# Patient Record
Sex: Female | Born: 1990 | Race: Black or African American | Hispanic: No | Marital: Married | Smoking: Never smoker
Health system: Southern US, Community
[De-identification: ages and names within clinical notes are randomized; demographics above are authoritative.]

## PROBLEM LIST (undated history)

## (undated) DIAGNOSIS — I341 Nonrheumatic mitral (valve) prolapse: Secondary | ICD-10-CM

## (undated) DIAGNOSIS — L309 Dermatitis, unspecified: Secondary | ICD-10-CM

## (undated) DIAGNOSIS — Z8679 Personal history of other diseases of the circulatory system: Secondary | ICD-10-CM

## (undated) DIAGNOSIS — L509 Urticaria, unspecified: Secondary | ICD-10-CM

## (undated) HISTORY — DX: Urticaria, unspecified: L50.9

## (undated) HISTORY — DX: Dermatitis, unspecified: L30.9

## (undated) HISTORY — PX: NO PAST SURGERIES: SHX2092

---

## 2014-06-30 ENCOUNTER — Other Ambulatory Visit (HOSPITAL_COMMUNITY)
Admission: RE | Admit: 2014-06-30 | Discharge: 2014-06-30 | Disposition: A | Discharge status: Home / Self Care | Payer: PRIVATE HEALTH INSURANCE | Source: Ambulatory Visit | Attending: Obstetrics and Gynecology | Admitting: Obstetrics and Gynecology

## 2014-06-30 DIAGNOSIS — Z01419 Encounter for gynecological examination (general) (routine) without abnormal findings: Secondary | ICD-10-CM | POA: Diagnosis not present

## 2014-12-22 ENCOUNTER — Ambulatory Visit (INDEPENDENT_AMBULATORY_CARE_PROVIDER_SITE_OTHER): Payer: PRIVATE HEALTH INSURANCE | Admitting: Internal Medicine

## 2014-12-22 ENCOUNTER — Encounter: Payer: Self-pay | Admitting: Internal Medicine

## 2014-12-22 VITALS — BP 134/82 | HR 77 | Ht 67.0 in | Wt 170.3 lb

## 2014-12-22 DIAGNOSIS — R0789 Other chest pain: Secondary | ICD-10-CM | POA: Diagnosis not present

## 2014-12-22 DIAGNOSIS — G43909 Migraine, unspecified, not intractable, without status migrainosus: Secondary | ICD-10-CM | POA: Insufficient documentation

## 2014-12-22 DIAGNOSIS — R011 Cardiac murmur, unspecified: Secondary | ICD-10-CM | POA: Diagnosis not present

## 2014-12-22 DIAGNOSIS — I340 Nonrheumatic mitral (valve) insufficiency: Secondary | ICD-10-CM

## 2014-12-22 DIAGNOSIS — R0609 Other forms of dyspnea: Secondary | ICD-10-CM | POA: Diagnosis not present

## 2014-12-22 DIAGNOSIS — R002 Palpitations: Secondary | ICD-10-CM | POA: Insufficient documentation

## 2014-12-22 DIAGNOSIS — R072 Precordial pain: Secondary | ICD-10-CM | POA: Diagnosis not present

## 2014-12-22 DIAGNOSIS — R079 Chest pain, unspecified: Secondary | ICD-10-CM | POA: Insufficient documentation

## 2014-12-22 DIAGNOSIS — G43009 Migraine without aura, not intractable, without status migrainosus: Secondary | ICD-10-CM

## 2014-12-22 HISTORY — DX: Nonrheumatic mitral (valve) insufficiency: I34.0

## 2014-12-22 NOTE — Progress Notes (Signed)
OFFICE NOTE  Chief Complaint:  Murmur, chest pain, shortness of breath, headache, palpitations  Primary Care Physician: No primary care provider on file.  HPI:  Alicia Kelley is a 24 year old female who recently graduated from [employer] and [employer]. 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 Her past medical history is significant for heart murmur which was detected at a young age. She side do cardiologist in [place] who was a pediatric cardiologist. She apparently had an echo in the past and described a valvular abnormality which sounds like mitral valve prolapse. Subsequently she transitioned to a cardiologist in [place] but cannot remember the name of the cardiologist. She's had an echocardiogram before but no records of that are available. Recently she's been having a number of symptoms which are concerning. She is reporting chest pain, shortness of breath, particularly walking up stairs, headache which sounds migrainous in nature-there is photophobia and phonophobia with associated nausea and light sensitivity. In addition she is reporting palpitations. Those occur mostly at night. Her chest pain is sharp and not necessarily associated with exertion. It seems to last for only a few minutes. She does report some anxiety particularly about the symptoms. She recently had screening blood work and physical through her primary care provider. Cholesterol appears to be well-controlled. Her EKG shows sinus rhythm but there are some nonspecific ST and T-wave changes. Family history is significant for murmur in her brother and father.  PMHx:  No past medical history on file.  No past surgical history on file.  FAMHx:  Family History  Problem Relation Age of Onset  . Hypertension Mother   . Healthy Father   . Heart murmur Sister   . Healthy Brother     SOCHx:   reports that she has never  smoked. She has never used smokeless tobacco. Her alcohol and drug histories are not on file.  ALLERGIES:  No Known Allergies  ROS: A comprehensive review of systems was negative except for: Constitutional: positive for fatigue Respiratory: positive for dyspnea on exertion Cardiovascular: positive for chest pain and palpitations Neurological: positive for headaches Behavioral/Psych: positive for anxiety  HOME MEDS: Current Outpatient Prescriptions  Medication Sig Dispense Refill  . NUVARING 0.12-0.015 MG/24HR vaginal ring Insert one (1) ring vaginally every three (3) weeks.     No current facility-administered medications for this visit.    LABS/IMAGING: No results found for this or any previous visit (from the past 48 hour(s)). No results found.  WEIGHTS: Wt Readings from Last 3 Encounters:  12/22/14 170 lb 4.8 oz (77.248 kg)    VITALS: BP 134/82 mmHg  Pulse 77  Ht  (1.702 m)  Wt 170 lb 4.8 oz (77.248 kg)  BMI 26.67 kg/m2  EXAM: General appearance: alert, no distress and thin Neck: no carotid bruit, no JVD and thyroid not enlarged, symmetric, no tenderness/mass/nodules Lungs: clear to auscultation bilaterally Heart: regular rate and rhythm, S1, S2 normal, systolic murmur: holosystolic 3/6, blowing at apex and mid systolic click present Abdomen: soft, non-tender; bowel sounds normal; no masses,  no organomegaly Extremities: extremities normal, atraumatic, no cyanosis or edema Pulses: 2+ and symmetric Skin: Skin color, texture, turgor normal. No rashes or lesions Neurologic: Grossly normal Psych: Mildly anxious  EKG: Normal sinus rhythm at 77, nonspecific ST and T-wave changes  ASSESSMENT: 1. Systolic murmur suggestive of mitral valve prolapse and regurgitation 2. Palpitations 3. Chest pain 4. Progressive  dyspnea on exertion 5. Headaches with migraine features 6. Anxiety  PLAN: 1.   Alicia Kelley has a number of medical problems which are concerning her.  This is causing her significant anxiety. She does have a loud murmur which sounds like mitral valve prolapse. There is a history of valvular disease in her father and sister. I will try to obtain records from her pediatric cardiologist, but plan to repeat an echocardiogram. We'll also schedule a treadmill exercise stress test. With regards her palpitations, this occur mostly on a weekly basis and we may need to consider monitoring after we get these initial test results back. Ultimately, she could benefit from a low-dose nonselective beta blocker which may help treat her palpitations, headache and the other symptoms.  Thanks for the kind referral.  Alicia NoseKenneth C. Raynor Calcaterra, MD, [facility] Attending Cardiologist [facility]  Alicia Kelley 12/22/2014, 5:23 PM

## 2014-12-22 NOTE — Patient Instructions (Signed)
Your physician has requested that you have an exercise tolerance test. For further information please visit [email or web address]. Please also follow instruction sheet, as given.  Your physician has requested that you have an echocardiogram. Echocardiography is a painless test that uses sound waves to create images of your heart. It provides your doctor with information about the size and shape of your heart and how well your heart's chambers and valves are working. This procedure takes approximately one hour. There are no restrictions for this procedure.  Your physician recommends that you schedule a follow-up appointment in: after tests  

## 2015-01-04 ENCOUNTER — Encounter: Payer: Self-pay | Admitting: Internal Medicine

## 2015-01-05 ENCOUNTER — Ambulatory Visit (HOSPITAL_COMMUNITY): Payer: PRIVATE HEALTH INSURANCE | Attending: Cardiology

## 2015-01-05 ENCOUNTER — Other Ambulatory Visit: Payer: Self-pay

## 2015-01-05 DIAGNOSIS — R079 Chest pain, unspecified: Secondary | ICD-10-CM | POA: Diagnosis present

## 2015-01-05 DIAGNOSIS — I340 Nonrheumatic mitral (valve) insufficiency: Secondary | ICD-10-CM | POA: Insufficient documentation

## 2015-01-05 DIAGNOSIS — R0789 Other chest pain: Secondary | ICD-10-CM | POA: Diagnosis not present

## 2015-01-05 DIAGNOSIS — I071 Rheumatic tricuspid insufficiency: Secondary | ICD-10-CM | POA: Diagnosis not present

## 2015-01-05 DIAGNOSIS — R011 Cardiac murmur, unspecified: Secondary | ICD-10-CM | POA: Insufficient documentation

## 2015-01-05 DIAGNOSIS — I341 Nonrheumatic mitral (valve) prolapse: Secondary | ICD-10-CM | POA: Insufficient documentation

## 2015-01-11 ENCOUNTER — Telehealth (HOSPITAL_COMMUNITY): Payer: Self-pay

## 2015-01-11 NOTE — Telephone Encounter (Signed)
Encounter complete. 

## 2015-01-13 ENCOUNTER — Ambulatory Visit (HOSPITAL_COMMUNITY)
Admission: RE | Admit: 2015-01-13 | Discharge: 2015-01-13 | Disposition: A | Discharge status: Home / Self Care | Payer: PRIVATE HEALTH INSURANCE | Source: Ambulatory Visit | Attending: Cardiovascular Disease | Admitting: Cardiovascular Disease

## 2015-01-13 DIAGNOSIS — R0789 Other chest pain: Secondary | ICD-10-CM

## 2015-01-13 DIAGNOSIS — R011 Cardiac murmur, unspecified: Secondary | ICD-10-CM | POA: Insufficient documentation

## 2015-01-13 LAB — EXERCISE TOLERANCE TEST
Estimated workload: 10.2 METS
Exercise duration (min): 9 min
MPHR: 198 {beats}/min
Peak HR: 173 {beats}/min
Percent HR: 87 %
RPE: 15
Rest HR: 84 {beats}/min

## 2015-01-27 ENCOUNTER — Telehealth: Payer: Self-pay | Admitting: Internal Medicine

## 2015-01-27 NOTE — Telephone Encounter (Signed)
[facility] 02/23/15 with Dr Rennis GoldenHilty.  [facility] for Dr Blanchie DessertHilty's schedule on 02/23/15.  lp

## 2015-02-09 ENCOUNTER — Telehealth: Payer: Self-pay | Admitting: Internal Medicine

## 2015-02-10 NOTE — Telephone Encounter (Signed)
Close encounter 

## 2015-02-21 ENCOUNTER — Ambulatory Visit: Payer: PRIVATE HEALTH INSURANCE | Admitting: Internal Medicine

## 2015-02-23 ENCOUNTER — Ambulatory Visit: Payer: PRIVATE HEALTH INSURANCE | Admitting: Internal Medicine

## 2015-03-08 ENCOUNTER — Encounter: Payer: Self-pay | Admitting: Internal Medicine

## 2015-03-08 ENCOUNTER — Ambulatory Visit (INDEPENDENT_AMBULATORY_CARE_PROVIDER_SITE_OTHER): Payer: PRIVATE HEALTH INSURANCE | Admitting: Internal Medicine

## 2015-03-08 VITALS — BP 126/74 | HR 94 | Ht 67.0 in | Wt 174.0 lb

## 2015-03-08 DIAGNOSIS — R002 Palpitations: Secondary | ICD-10-CM

## 2015-03-08 DIAGNOSIS — R011 Cardiac murmur, unspecified: Secondary | ICD-10-CM | POA: Diagnosis not present

## 2015-03-08 DIAGNOSIS — R0609 Other forms of dyspnea: Secondary | ICD-10-CM | POA: Diagnosis not present

## 2015-03-08 DIAGNOSIS — I471 Supraventricular tachycardia: Secondary | ICD-10-CM | POA: Diagnosis not present

## 2015-03-08 MED ORDER — PROPRANOLOL HCL ER 60 MG PO CP24: 60.0000 mg | ORAL_CAPSULE | Freq: Every day | ORAL | Status: DC

## 2015-03-08 NOTE — Patient Instructions (Signed)
Medication Instructions:   START propranolol  once daily  Labwork:  none  Testing/Procedures:  none  Follow-Up:  Your physician wants you to follow-up in: 6 months with Dr. Rennis Golden. You will receive a reminder letter in the mail two months in advance. If you don't receive a letter, please call our office to schedule the follow-up appointment.   Any Other Special Instructions Will Be Listed Below (If Applicable).

## 2015-03-09 DIAGNOSIS — I471 Supraventricular tachycardia: Secondary | ICD-10-CM | POA: Insufficient documentation

## 2015-03-09 DIAGNOSIS — Z8679 Personal history of other diseases of the circulatory system: Secondary | ICD-10-CM | POA: Insufficient documentation

## 2015-03-09 NOTE — Progress Notes (Signed)
OFFICE NOTE  Chief Complaint:  Follow-up stress test and echo  Primary Care Physician: Alicia Salt, PA  HPI:  Alicia Kelley is a 25 year old female who recently graduated from [employer]. 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 Her past medical history is significant for heart murmur which was detected at a young age. She side do cardiologist in [place] who was a pediatric cardiologist. She apparently had an echo in the past and described a valvular abnormality which sounds like mitral valve prolapse. Subsequently she transitioned to a cardiologist in [place] but cannot remember the name of the cardiologist. She's had an echocardiogram before but no records of that are available. Recently she's been having a number of symptoms which are concerning. She is reporting chest pain, shortness of breath, particularly walking up stairs, headache which sounds migrainous in nature-there is photophobia and phonophobia with associated nausea and light sensitivity. In addition she is reporting palpitations. Those occur mostly at night. Her chest pain is sharp and not necessarily associated with exertion. It seems to last for only a few minutes. She does report some anxiety particularly about the symptoms. She recently had screening blood work and physical through her primary care provider. Cholesterol appears to be well-controlled. Her EKG shows sinus rhythm but there are some nonspecific ST and T-wave changes. Family history is significant for murmur in her brother and father.  I the pleasure see Ms. Alicia Kelley back today in follow-up. She underwent an exercise treadmill stress test which was low risk however she had interim development of PAT or possibly atrial tachycardia during the procedure. This likely explains the palpitation she's been feeling. A repeat echo was performed which  shows normal LV function however she does have thickening and bileaflet prolapse of the mitral valve and moderate regurgitation. This is similar to the findings of her echo which was performed by [facility] pediatric cardiology in 2014. She was previously seeing Alicia Kelley. The findings now indicate that she does have some left atrial enlargement which is mild and was not previously present.  PMHx:  History reviewed. No pertinent past medical history.  History reviewed. No pertinent past surgical history.  FAMHx:  Family History  Problem Relation Age of Onset  . Hypertension Mother   . Healthy Father   . Heart murmur Sister   . Healthy Brother     SOCHx:   reports that she has never smoked. She has never used smokeless tobacco. Her alcohol and drug histories are not on file.  ALLERGIES:  No Known Allergies  ROS: A comprehensive review of systems was negative except for: Constitutional: positive for fatigue Respiratory: positive for dyspnea on exertion Cardiovascular: positive for chest pain and palpitations Neurological: positive for headaches Behavioral/Psych: positive for anxiety  HOME MEDS: Current Outpatient Prescriptions  Medication Sig Dispense Refill  . Multiple Vitamins-Minerals (MULTIVITAMIN ADULT PO) Take 1 tablet by mouth daily.    Marland Kitchen NUVARING 0.12-0.015 MG/24HR vaginal ring Insert one (1) ring vaginally every three (3) weeks.    . vitamin E 400 UNIT capsule Take 400 Units by mouth daily.    . propranolol ER (INDERAL LA) 60 MG 24 hr capsule Take 1 capsule (60 mg total) by mouth daily. 30 capsule 6   No current facility-administered medications for this visit.    LABS/IMAGING: No results found for this or any previous visit (from the past 48 hour(s)). No  results found.  WEIGHTS: Wt Readings from Last 3 Encounters:  03/08/15 174 lb (78.926 kg)  12/22/14 170 lb 4.8 oz (77.248 kg)    VITALS: BP 126/74 mmHg  Pulse 94  Ht  (1.702 m)  Wt 174 lb (78.926 kg)  BMI  27.25 kg/m2  EXAM: deferred  EKG: Deferred  ASSESSMENT: 1. Mitral valve prolapse with moderate regurgitation 2. Left atrial enlargement 3. PAT or paroxysmal atrial flutter 4. Chest pain 5. Progressive dyspnea on exertion 6. Headaches with migraine features 7. Anxiety  PLAN: 1.   Ms. 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 She has bileaflet prolapse and moderate regurgitation. Unfortunately she's developed some mild left atrial enlargement. We talked at length today about the long-term history of mitral regurgitation and even though that her regurgitation is moderate comments likely that she could develop worsening left atrial enlargement and put her at much higher risk of recurrent atrial fibrillation or flutter. 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 This is a constellation of symptoms which is quite common. She may benefit from low-dose beta blocker. I proposed a nonselective beta blocker propranolol XL 60 mg daily. We'll try that to see if it helps with her symptoms and may to some extent suppress some of her atrial tachycardias. She wishes to continue to follow with me and will follow her valve with echocardiography. She may wish to have earlier referral to specialty Center as many centers are now operating a moderate regurgitation hopefully preventing the structural heart changes that can develop later on.  Follow-up in 6 months.  Alicia Nose, MD, [facility] Attending Cardiologist [facility]  Alicia Kelley 03/09/2015, 7:42 AM

## 2019-07-10 ENCOUNTER — Telehealth: Payer: Self-pay

## 2019-07-10 NOTE — Telephone Encounter (Signed)
OR

## 2019-08-19 ENCOUNTER — Encounter: Payer: Self-pay | Admitting: Internal Medicine

## 2019-08-19 ENCOUNTER — Other Ambulatory Visit: Payer: Self-pay

## 2019-08-19 ENCOUNTER — Ambulatory Visit (INDEPENDENT_AMBULATORY_CARE_PROVIDER_SITE_OTHER): Payer: Managed Care, Other (non HMO) | Admitting: Internal Medicine

## 2019-08-19 VITALS — BP 112/80 | HR 81 | Ht 68.0 in | Wt 190.0 lb

## 2019-08-19 DIAGNOSIS — I341 Nonrheumatic mitral (valve) prolapse: Secondary | ICD-10-CM

## 2019-08-19 DIAGNOSIS — R002 Palpitations: Secondary | ICD-10-CM

## 2019-08-19 MED ORDER — METOPROLOL TARTRATE 25 MG PO TABS
ORAL_TABLET | ORAL | 3 refills | Status: DC
Start: 2019-08-19 — End: 2021-12-01

## 2019-08-19 NOTE — Patient Instructions (Signed)
Medication Instructions:  Dr. Rennis Golden has prescribed metoprolol tartrate to use as needed for palpitations   *If you need a refill on your cardiac medications before your next appointment, please call your pharmacy*  Testing/Procedures: Your physician has requested that you have an echocardiogram. Echocardiography is a painless test that uses sound waves to create images of your heart. It provides your doctor with information about the size and shape of your heart and how well your heart's chambers and valves are working. This procedure takes approximately one hour. There are no restrictions for this procedure. -- [address] - [address] Floor   Follow-Up: At [facility], you and your health needs are our priority.  As part of our continuing mission to provide you with exceptional heart care, we have created designated Provider Care Teams.  These Care Teams include your primary Cardiologist (physician) and Advanced Practice Providers (APPs -  Physician Assistants and Nurse Practitioners) who all work together to provide you with the care you need, when you need it.  We recommend signing up for the patient portal called "MyChart".  Sign up information is provided on this After Visit Summary.  MyChart is used to connect with patients for Virtual Visits (Telemedicine).  Patients are able to view lab/test results, encounter notes, upcoming appointments, etc.  Non-urgent messages can be sent to your provider as well.   To learn more about what you can do with MyChart, go to [email or web address].    Your next appointment:   After echocardiogram  The format for your next appointment:   In Person  Provider:   You may see Dr. Rennis Golden or one of the following Advanced Practice Providers on your designated Care Team:    Azalee Course, PA-C  Micah Flesher, [place] or   Judy Pimple, [place]    Other Instructions

## 2019-08-19 NOTE — Progress Notes (Signed)
OFFICE NOTE  Chief Complaint:  Reestablish cardiac care  Primary Care Physician: Norm Salt, PA  HPI:  Alicia Kelley is a 29 year old female who recently graduated from [employer]. 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 Her past medical history is significant for heart murmur which was detected at a young age. She side do cardiologist in [place] who was a pediatric cardiologist. She apparently had an echo in the past and described a valvular abnormality which sounds like mitral valve prolapse. Subsequently she transitioned to a cardiologist in [place] but cannot remember the name of the cardiologist. She's had an echocardiogram before but no records of that are available. Recently she's been having a number of symptoms which are concerning. She is reporting chest pain, shortness of breath, particularly walking up stairs, headache which sounds migrainous in nature-there is photophobia and phonophobia with associated nausea and light sensitivity. In addition she is reporting palpitations. Those occur mostly at night. Her chest pain is sharp and not necessarily associated with exertion. It seems to last for only a few minutes. She does report some anxiety particularly about the symptoms. She recently had screening blood work and physical through her primary care provider. Cholesterol appears to be well-controlled. Her EKG shows sinus rhythm but there are some nonspecific ST and T-wave changes. Family history is significant for murmur in her brother and father.  I the pleasure see Alicia Kelley back today in follow-up. She underwent an exercise treadmill stress test which was low risk however she had interim development of PAT or possibly atrial tachycardia during the procedure. This likely explains the palpitation she's been feeling. A repeat echo was performed which shows  normal LV function however she does have thickening and bileaflet prolapse of the mitral valve and moderate regurgitation. This is similar to the findings of her echo which was performed by [facility]ology in 2014. She was previously seeing Dr. Mayer Camel. The findings now indicate that she does have some left atrial enlargement which is mild and was not previously present.  08/19/2019  Alicia Kelley is here to reestablish cardiac care.  She is considered a new patient since I have not seen her in more than 3 years.  It sounds like overall she has been doing well until recently when she has developed some palpitations.  Those were more frequent several weeks ago.  She had changed her diet around some and was drinking a tea which apparently contained twice as much caffeine is coffee and I suspect this may have provoked her palpitations.  We had discovered that she has moderate mitral valve prolapse with mild to moderate mitral regurgitation and some left atrial enlargement by echo in 2016.  She is not had a repeat echo since then.  Overall she denies any chest pain or worsening shortness of breath with exertion.  PMHx:  No past medical history on file.  No past surgical history on file.  FAMHx:  Family History  Problem Relation Age of Onset   Hypertension Mother    Healthy Father    Heart murmur Sister    Healthy Brother     SOCHx:   reports that she has never smoked. She has never used smokeless tobacco. No history on file for alcohol use and drug use.  ALLERGIES:  No Known Allergies  ROS: Pertinent items noted in HPI and remainder of comprehensive ROS otherwise  negative.  HOME MEDS: Current Outpatient Medications  Medication Sig Dispense Refill   Biotin 300 MCG TABS Take 1 tablet by mouth. 1 tablet daily     folic acid (FOLVITE) 1 MG tablet Take 1 mg by mouth daily. 1 tablet Daily     Multiple Vitamins-Minerals (MULTIVITAMIN ADULT PO) Take 1 tablet by mouth daily.      Prenatal Multivit-Min-Fe-FA (PRE-NATAL FORMULA) TABS Take 2 tablets by mouth. 12 tablets daily     metoprolol tartrate (LOPRESSOR) 25 MG tablet Take one-half to one tablet by mouth twice daily as needed for palpitations 60 tablet 3   No current facility-administered medications for this visit.    LABS/IMAGING: No results found for this or any previous visit (from the past 48 hour(s)). No results found.  WEIGHTS: Wt Readings from Last 3 Encounters:  08/19/19 190 lb (86.2 kg)  03/08/15 174 lb (78.9 kg)  12/22/14 170 lb 4.8 oz (77.2 kg)    VITALS: BP 112/80    Pulse 81    Ht 5\' 8"  (1.727 m)    Wt 190 lb (86.2 kg)    SpO2 98%    BMI 28.89 kg/m   EXAM: General appearance: alert and no distress Neck: no carotid bruit, no JVD and thyroid not enlarged, symmetric, no tenderness/mass/nodules Lungs: clear to auscultation bilaterally Heart: regular rate and rhythm, S1, S2 normal and systolic murmur: early systolic 3/6, blowing at apex Abdomen: soft, non-tender; bowel sounds normal; no masses,  no organomegaly Extremities: extremities normal, atraumatic, no cyanosis or edema Pulses: 2+ and symmetric Skin: Skin color, texture, turgor normal. No rashes or lesions Neurologic: Grossly normal Psych: Pleasant  EKG: Normal sinus rhythm 81, nonspecific ST-T wave changes-personally reviewed  ASSESSMENT: 1. Mitral valve prolapse with moderate regurgitation 2. Palpitations 3. Left atrial enlargement 4. PAT or paroxysmal atrial flutter 5. Chest pain 6. Headaches with migraine features 7. Anxiety  PLAN: 1.   Ms. had recent palpitations although they have improved somewhat.  She is no longer on propranolol which she took for short period of time many years ago.  I will provide her with some metoprolol tartrate should she could take between 12-1/2 and 25 mg up to twice a day if needed for persistent palpitations/tachycardia.  She does have a loud mitral murmur and was noted to have at  least moderate regurgitation in 2016 by echo.  I would recommend a repeat echo to look at the stability of that valve.  She does not seem to necessarily be symptomatic with it however if it is progressed to severe regurgitation or appears moderate to severe we may need to further evaluated with TEE to determine if she will possibly need mitral valve replacement at sometime in the near future.  Follow-up with me afterwards.  2017, MD, [facility], FACP  [place]   [facility]  Medical Director of the Advanced Lipid Disorders &  Cardiovascular Risk Reduction Clinic Diplomate of the American Board of Clinical Lipidology Attending Cardiologist  Direct Dial: [phone]   Fax: [phone]  Website:  [email or web address] 08/19/2019, 8:58 PM

## 2019-09-02 ENCOUNTER — Other Ambulatory Visit: Payer: Self-pay

## 2019-09-02 ENCOUNTER — Ambulatory Visit (HOSPITAL_COMMUNITY): Payer: Managed Care, Other (non HMO) | Attending: Cardiology

## 2019-09-02 DIAGNOSIS — I341 Nonrheumatic mitral (valve) prolapse: Secondary | ICD-10-CM | POA: Diagnosis present

## 2019-09-02 DIAGNOSIS — R002 Palpitations: Secondary | ICD-10-CM | POA: Insufficient documentation

## 2019-09-02 LAB — ECHOCARDIOGRAM COMPLETE
Area-P 1/2: 3.85 cm2
S' Lateral: 3 cm

## 2019-10-23 ENCOUNTER — Ambulatory Visit: Payer: Managed Care, Other (non HMO) | Admitting: Internal Medicine

## 2019-11-17 LAB — OB RESULTS CONSOLE RUBELLA ANTIBODY, IGM: Rubella: IMMUNE

## 2019-11-17 LAB — OB RESULTS CONSOLE HEPATITIS B SURFACE ANTIGEN: Hepatitis B Surface Ag: NEGATIVE

## 2020-01-18 ENCOUNTER — Other Ambulatory Visit: Payer: Self-pay

## 2020-01-18 ENCOUNTER — Ambulatory Visit
Admission: EM | Admit: 2020-01-18 | Discharge: 2020-01-18 | Disposition: A | Discharge status: Home / Self Care | Payer: Managed Care, Other (non HMO)

## 2020-01-23 NOTE — L&D Delivery Note (Signed)
Delivery Note Labor onset: 07/02/2020  Labor Onset Time: 0456 Complete dilation at 11:08 AM  Onset of pushing at 1116 FHR second stage Cat 1 Analgesia/Anesthesia intrapartum: None  Guided pushing with maternal urge. FHR in the 90's without out ascension, x6 min, right mediolateral episiotomy performed. Delivery of a viable female at 34. Fetal head delivered in LOA position.  Nuchal cord: x1, loose, reduced on perineum.  Infant placed on maternal abd, dried, and tactile stim.  Cord double clamped after pulsation stopped and cut by Father.  Father and pt's sister present for birth.  Cord blood sample collected: Yes Arterial cord blood sample collected: No  Placenta delivered Tomasa Blase, intact, with 3 VC.  Placenta to L&D. Uterine tone firm, bleeding moderate w/ clots. IV dislodged during second staged. Bleeding managed w/ Pitocin 10 units IM  Right mediolateral episiotomy w/o extension identified.  Anesthesia: Lidocaine 1% Repair: 2-0 Vicryl CT QBL/EBL (mL): 350 Complications: terminal bradycardia APGAR: APGAR (1 MIN):  8 APGAR (5 MINS): 9   APGAR (10 MINS):   Mom to postpartum.  Baby to Couplet care / Skin to Skin. Baby Boy "[place]" parents request in-pt circ.   Roma Schanz MSN, CNM 07/02/2020, 12:30 PM

## 2020-01-28 ENCOUNTER — Telehealth: Payer: Managed Care, Other (non HMO) | Admitting: Physician Assistant

## 2020-02-01 ENCOUNTER — Telehealth (INDEPENDENT_AMBULATORY_CARE_PROVIDER_SITE_OTHER): Payer: Managed Care, Other (non HMO) | Admitting: Cardiology

## 2020-02-01 VITALS — BP 130/75 | HR 101 | Ht 68.0 in | Wt 189.0 lb

## 2020-02-01 DIAGNOSIS — Z8679 Personal history of other diseases of the circulatory system: Secondary | ICD-10-CM | POA: Diagnosis not present

## 2020-02-01 DIAGNOSIS — I341 Nonrheumatic mitral (valve) prolapse: Secondary | ICD-10-CM

## 2020-02-01 DIAGNOSIS — I340 Nonrheumatic mitral (valve) insufficiency: Secondary | ICD-10-CM | POA: Diagnosis not present

## 2020-02-01 HISTORY — DX: Nonrheumatic mitral (valve) prolapse: I34.1

## 2020-02-01 NOTE — Progress Notes (Signed)
Virtual Visit via Telephone Note   This visit type was conducted due to national recommendations for restrictions regarding the COVID-19 Pandemic (e.g. social distancing) in an effort to limit this patient's exposure and mitigate transmission in our community.  Due to her co-morbid illnesses, this patient is at least at moderate risk for complications without adequate follow up.  This format is felt to be most appropriate for this patient at this time.  The patient did not have access to video technology/had technical difficulties with video requiring transitioning to audio format only (telephone).  All issues noted in this document were discussed and addressed.  No physical exam could be performed with this format.  Please refer to the patient's chart for her  consent to telehealth for [facility].    Date:  02/01/2020   ID:  Alicia Kelley, DOB [birth year], MRN [id number] The patient was identified using 2 identifiers.  Patient Location: Home Provider Location: Home Office  PCP:  Pcp, No  Cardiologist:  [name], MD  Electrophysiologist:  None   Evaluation Performed:  Follow-Up Visit  Chief Complaint:  none  History of Present Illness:    Alicia Kelley is a 30 y.o. female with a history of mitral valve prolapse and moderate mitral regurgitation.  She is also had history of PAT in the past.  She had been on a beta-blocker but tells me that she is now [redacted] weeks pregnant.  She is not taking her beta-blocker now.  She has not been troubled with palpitations.  She did have CO VID couple weeks ago over the holidays.  She has had some residual complaints of headache but no other symptoms that are concerning.  She denies any unusual shortness of breath.  Her pregnancy is going well so far.  The patient does not have symptoms concerning for COVID-19 infection (fever, chills, cough, or new shortness of breath).    No past medical history on file. No past surgical history on file.    Current Meds  Medication Sig  . folic acid (FOLVITE) 1 MG tablet Take 1 mg by mouth daily. 1 tablet Daily  . metoprolol tartrate (LOPRESSOR) 25 MG tablet Take one-half to one tablet by mouth twice daily as needed for palpitations  . Prenatal Multivit-Min-Fe-FA (PRE-NATAL FORMULA) TABS Take 2 tablets by mouth. 12 tablets daily     Allergies:   Patient has no known allergies.   Social History   Tobacco Use  . Smoking status: Never Smoker  . Smokeless tobacco: Never Used     Family Hx: The patient's family history includes Healthy in her brother and father; Heart murmur in her sister; Hypertension in her mother.  ROS:   Please see the history of present illness.    All other systems reviewed and are negative.   Prior CV studies:   The following studies were reviewed today: Echo 09/02/2019- IMPRESSIONS    1. Left ventricular ejection fraction, by estimation, is 60 to 65%. The  left ventricle has normal function. The left ventricle has no regional  wall motion abnormalities. Left ventricular diastolic parameters were  normal. The average left ventricular  global longitudinal strain is -24.8 %. The global longitudinal strain is  normal.  2. Right ventricular systolic function is normal. The right ventricular  size is normal.  3. Mid systolic MVP regurgitation. The mitral valve is myxomatous.  Moderate mitral valve regurgitation. No evidence of mitral stenosis.  4. The aortic valve is normal in structure. Aortic valve regurgitation  is  not visualized. No aortic stenosis is present.  5. The inferior vena cava is normal in size with greater than 50%  respiratory variability, suggesting right atrial pressure of 3 mmHg.   Comparison(s): Prior images unable to be directly viewed, comparison made  by report only. 01/05/15.   Conclusion(s)/Recommendation(s): Consider cardiac MRI to better clarify  regurgitant volume.    Labs/Other Tests and Data Reviewed:    EKG:  An  ECG dated 08/19/2019 was personally reviewed today and demonstrated:  NSR  Recent Labs: No results found for requested labs within last 8760 hours.   Recent Lipid Panel No results found for: CHOL, TRIG, HDL, CHOLHDL, LDLCALC, LDLDIRECT  Wt Readings from Last 3 Encounters:  02/01/20 189 lb (85.7 kg)  08/19/19 190 lb (86.2 kg)  03/08/15 174 lb (78.9 kg)     Risk Assessment/Calculations:      Objective:    Vital Signs:  BP 130/75   Pulse (!) 101   Ht 5\' 8"  (1.727 m)   Wt 189 lb (85.7 kg)   BMI 28.74 kg/m    VITAL SIGNS:  reviewed  ASSESSMENT & PLAN:    Moderate MR- Echo Aug 2021  Pregnancy- Pt is [redacted] weeks pregnant-  H/O PAT- No palpitations- I asked her to let Sep 2021 know if she develops increasing palpitations or SOB.  I suggested she remain off Metoprolol if possible.  If she has increasing symptoms she should contact us to discus treatment.   Plan: F/U Dr [place] in 9 months or sooner PRN.        COVID-19 Education: The signs and symptoms of COVID-19 were discussed with the patient and how to seek care for testing (follow up with PCP or arrange E-visit).  The importance of social distancing was discussed today.  Time:   Today, I have spent 15 minutes with the patient with telehealth technology discussing the above problems.     Medication Adjustments/Labs and Tests Ordered: Current medicines are reviewed at length with the patient today.  Concerns regarding medicines are outlined above.   Tests Ordered: No orders of the defined types were placed in this encounter.   Medication Changes: No orders of the defined types were placed in this encounter.   Follow Up:  In Person Dr Rennis Golden in 9 months  Signed, Rennis Golden, Corine Shelter  02/01/2020 11:33 AM    [facility]

## 2020-02-01 NOTE — Patient Instructions (Signed)
Medication Instructions:  Your Physician recommend you continue on your current medication as directed.    *If you need a refill on your cardiac medications before your next appointment, please call your pharmacy*   Lab Work: None   Testing/Procedures: None   Follow-Up: At [facility], you and your health needs are our priority.  As part of our continuing mission to provide you with exceptional heart care, we have created designated Provider Care Teams.  These Care Teams include your primary Cardiologist (physician) and Advanced Practice Providers (APPs -  Physician Assistants and Nurse Practitioners) who all work together to provide you with the care you need, when you need it.  We recommend signing up for the patient portal called "MyChart".  Sign up information is provided on this After Visit Summary.  MyChart is used to connect with patients for Virtual Visits (Telemedicine).  Patients are able to view lab/test results, encounter notes, upcoming appointments, etc.  Non-urgent messages can be sent to your provider as well.   To learn more about what you can do with MyChart, go to [email or web address].    Your next appointment:   9 months  The format for your next appointment:   In Person  Provider:   K. Italy Hilty, MD

## 2020-03-31 LAB — OB RESULTS CONSOLE RPR: RPR: NONREACTIVE

## 2020-03-31 LAB — OB RESULTS CONSOLE HIV ANTIBODY (ROUTINE TESTING): HIV: NONREACTIVE

## 2020-06-10 LAB — OB RESULTS CONSOLE GC/CHLAMYDIA
Chlamydia: NEGATIVE
Gonorrhea: NEGATIVE

## 2020-06-10 LAB — OB RESULTS CONSOLE GBS: GBS: POSITIVE

## 2020-07-01 ENCOUNTER — Inpatient Hospital Stay (HOSPITAL_BASED_OUTPATIENT_CLINIC_OR_DEPARTMENT_OTHER): Payer: Managed Care, Other (non HMO)

## 2020-07-01 ENCOUNTER — Encounter (HOSPITAL_COMMUNITY): Payer: Self-pay | Admitting: Obstetrics and Gynecology

## 2020-07-01 ENCOUNTER — Observation Stay (HOSPITAL_COMMUNITY)
Admission: AD | Admit: 2020-07-01 | Discharge: 2020-07-01 | Disposition: A | Discharge status: Home / Self Care | Payer: Managed Care, Other (non HMO) | Attending: Obstetrics and Gynecology | Admitting: Obstetrics and Gynecology

## 2020-07-01 DIAGNOSIS — Z8616 Personal history of COVID-19: Secondary | ICD-10-CM | POA: Insufficient documentation

## 2020-07-01 DIAGNOSIS — Z3A39 39 weeks gestation of pregnancy: Secondary | ICD-10-CM | POA: Diagnosis not present

## 2020-07-01 DIAGNOSIS — O368130 Decreased fetal movements, third trimester, not applicable or unspecified: Secondary | ICD-10-CM | POA: Diagnosis not present

## 2020-07-01 DIAGNOSIS — O288 Other abnormal findings on antenatal screening of mother: Secondary | ICD-10-CM | POA: Diagnosis present

## 2020-07-01 DIAGNOSIS — O368190 Decreased fetal movements, unspecified trimester, not applicable or unspecified: Secondary | ICD-10-CM | POA: Diagnosis present

## 2020-07-01 HISTORY — DX: Personal history of other diseases of the circulatory system: Z86.79

## 2020-07-01 HISTORY — DX: Nonrheumatic mitral (valve) prolapse: I34.1

## 2020-07-01 MED ORDER — ONDANSETRON HCL 4 MG/2ML IJ SOLN: 4.0000 mg | Freq: Four times a day (QID) | INTRAMUSCULAR | Status: DC | PRN

## 2020-07-01 MED ORDER — OXYTOCIN BOLUS FROM INFUSION: 333.0000 mL | Freq: Once | INTRAVENOUS | Status: DC

## 2020-07-01 MED ORDER — TERBUTALINE SULFATE 1 MG/ML IJ SOLN: 0.2500 mg | Freq: Once | INTRAMUSCULAR | Status: DC | PRN

## 2020-07-01 MED ORDER — LIDOCAINE HCL (PF) 1 % IJ SOLN: 30.0000 mL | INTRAMUSCULAR | Status: DC | PRN

## 2020-07-01 MED ORDER — SODIUM CHLORIDE 0.9 % IV SOLN: 5.0000 10*6.[IU] | Freq: Once | INTRAVENOUS | Status: DC

## 2020-07-01 MED ORDER — ACETAMINOPHEN 325 MG PO TABS: 650.0000 mg | ORAL_TABLET | ORAL | Status: DC | PRN

## 2020-07-01 MED ORDER — OXYCODONE-ACETAMINOPHEN 5-325 MG PO TABS: 2.0000 | ORAL_TABLET | ORAL | Status: DC | PRN

## 2020-07-01 MED ORDER — OXYTOCIN-SODIUM CHLORIDE 30-0.9 UT/500ML-% IV SOLN: 2.5000 [IU]/h | INTRAVENOUS | Status: DC

## 2020-07-01 MED ORDER — OXYTOCIN-SODIUM CHLORIDE 30-0.9 UT/500ML-% IV SOLN: 1.0000 m[IU]/min | INTRAVENOUS | Status: DC

## 2020-07-01 MED ORDER — PENICILLIN G POT IN DEXTROSE 60000 UNIT/ML IV SOLN: 3.0000 10*6.[IU] | INTRAVENOUS | Status: DC

## 2020-07-01 MED ORDER — FENTANYL CITRATE (PF) 100 MCG/2ML IJ SOLN: 50.0000 ug | INTRAMUSCULAR | Status: DC | PRN

## 2020-07-01 MED ORDER — SOD CITRATE-CITRIC ACID 500-334 MG/5ML PO SOLN: 30.0000 mL | ORAL | Status: DC | PRN

## 2020-07-01 MED ORDER — LACTATED RINGERS IV SOLN: 500.0000 mL | INTRAVENOUS | Status: DC | PRN

## 2020-07-01 MED ORDER — HYDROXYZINE HCL 50 MG PO TABS: 50.0000 mg | ORAL_TABLET | Freq: Four times a day (QID) | ORAL | Status: DC | PRN

## 2020-07-01 MED ORDER — OXYCODONE-ACETAMINOPHEN 5-325 MG PO TABS: 1.0000 | ORAL_TABLET | ORAL | Status: DC | PRN

## 2020-07-01 NOTE — Progress Notes (Signed)
Monitors off for transfer to ultrasound for BPP.

## 2020-07-01 NOTE — Discharge Summary (Addendum)
OB Discharge Summary     Patient Name: Alicia Kelley DOB: Jun 12, 1990 MRN: [id number]  Date of admission: 07/01/2020 Delivering MD: This patient has no babies on file.   Admitting diagnosis: Non-reactive NST (non-stress test) [O28.8] Intrauterine pregnancy: Unknown     Secondary diagnosis:  Principal Problem:   Non-reactive NST (non-stress test) Active Problems:   Decreased fetal movement                                History of Present Illness: Ms. Alicia Kelley is a 30 y.o. female, G1P0, who presents at Unknown weeks gestation. The patient has been followed at  [facility] and Gynecology  Her pregnancy has been complicated by:  Patient Active Problem List   Diagnosis Date Noted   Non-reactive NST (non-stress test) 07/01/2020   Decreased fetal movement 07/01/2020   MVP (mitral valve prolapse) 02/01/2020   History of PAT (paroxysmal atrial tachycardia) 03/09/2015   Moderate mitral regurgitation 12/22/2014   Chest pain 12/22/2014   DOE (dyspnea on exertion) 12/22/2014   Heart palpitations 12/22/2014   Migraine 12/22/2014     Active Ambulatory Problems    Diagnosis Date Noted   Moderate mitral regurgitation 12/22/2014   Chest pain 12/22/2014   DOE (dyspnea on exertion) 12/22/2014   Heart palpitations 12/22/2014   Migraine 12/22/2014   History of PAT (paroxysmal atrial tachycardia) 03/09/2015   MVP (mitral valve prolapse) 02/01/2020   Resolved Ambulatory Problems    Diagnosis Date Noted   No Resolved Ambulatory Problems   Past Medical History:  Diagnosis Date   H/O mitral valve prolapse    Mitral valve prolapse      Hospital course:   Pt was admitted on 07/01/2020 for the following, HPI per [facility] Physician DR Connye Burkitt  HPI: 30 y.o. G1P0 @ [redacted]w[redacted]d estimated gestational age (as dated by LMP c/w 7 week ultrasound) is admitted for induction of labor for decreased fetal movement and non-reactive NST.   Patient reports some decrease in movement which she  felt was normal and did not have concern regarding the decrease. NST in the office non-reactive but reassuring. She desired to await natural labor by 41 weeks but ultimately in agreement with induction of labor now.   Leakage of fluid:  No Vaginal bleeding:  No Contractions:  No Fetal movement:  Decreased   Prenatal care has been provided by Dr. Steva Ready [facility])   ROS:  Denies fevers, chills, chest pain, visual changes, SOB, RUQ/epigastric pain, N/V, dysuria, hematuria, or sudden onset/worsening bilateral LE or facial edema.   Pregnancy complicated by: Mitral valve prolapse with moderate regurgitation and mild left atrial enlargement Size greater than dates this pregnancy (normal growth [place]) Covid-19 this pregnancy, resolved (12/2019)  07/01/2020 Pt presented and appear shocked and confused to be getting any induction, pt placed on NST, reactive and Cat 1 with 15x15 acels noted, pt ate dinner prior to coming and pt performed fetal kick counts here with 10 movement noted in less then 2 hours. BPP also done resulted 8/8, pt stable, Discussed recommendation of staying for IOL at term, D Sallye Ober and DR Connye Burkitt recommend staying for induction, pt given R/B/A, pt took all assessment under consideration and decided to go home to wait for natural labor to ensure. Pt will f/u with [facility] Physician early next week.   NST: baseline 145, moderate variability, +acels, -decels, Reactive NST. Cat 1  Physical exam  Vitals:   07/01/20 2120 07/01/20 2155  BP: 123/70 125/60  Pulse: 75 72  Resp: 18 18  Temp: 98 F (36.7 C) 97.7 F (36.5 C)  TempSrc: Axillary Oral   General: alert, cooperative, and no distress Lochia: appropriate Uterine Fundus: firm Perineum: Intact DVT Evaluation: No evidence of DVT seen on physical exam. Negative Homan's sign. No cords or calf tenderness. No significant calf/ankle edema.  Labs: No results found for: WBC, HGB, HCT, MCV, PLT No flowsheet data  found.  Date of discharge: 07/01/2020 Discharge Diagnoses:  DFM Discharge instruction: per After Visit Summary and "Baby and Me Booklet".  After visit meds:   Activity:           unrestricted Advance as tolerated. Pelvic rest for 6 weeks.  Diet:                routine Medications: PNV Condition:  Pt discharge to home in stable LABOR: When contractions begin, you should start to time them from the beginning of one contraction to the beginning of the next.  When contractions are 5-10 minutes apart or less and have been regular for at least an hour, you should call your health care provider.  Notify your doctor if any of the following occur: 1. Bleeding from the vagina 7. Sudden, constant, or occasional abdominal pain  2. Pain or burning when urinating 8. Sudden gushing of fluid from the vagina (with or without continued leaking)  3. Chills or fever 9. Fainting spells, "black outs" or loss of consciousness  4. Increase in vaginal discharge 10. Severe or continued nausea or vomiting  5. Pelvic pressure (sudden increase) 11. Blurring of vision or spots before the eyes  6. Baby moving less than usual 12. Leaking of fluid    FETAL KICK COUNT: Lie on your left side for one hour after a meal, and count the number of times your baby kicks. If it is less than 5 times, get up, move around and drink some juice. Repeat the test 30 minutes later. If it is still less than 5 kicks in an hour, notify your doctor.  Meds: Allergies as of 07/01/2020   No Known Allergies      Medication List     TAKE these medications    folic acid 1 MG tablet Commonly known as: FOLVITE Take 1 mg by mouth daily. 1 tablet Daily   metoprolol tartrate 25 MG tablet Commonly known as: LOPRESSOR Take one-half to one tablet by mouth twice daily as needed for palpitations   Pre-Natal Formula Tabs Take 2 tablets by mouth. 12 tablets daily        Discharge Follow Up:   Follow-up Information     Gynecology, [facility]  Obstetrics And. Schedule an appointment as soon as possible for a visit.   Specialty: Obstetrics and Gynecology Why: Early next week. Contact information: [address] [place] [address] [phone]                  [facility], NP-C, CNM 07/01/2020, 10:12 PM  Dale Mio, FNP  MD Addendum. Patient reported feeling normal fetal movements or more than 10 kicks in 2 hours.  NST was category 1.  She desires to be discharged to return home and she was deemed stable for discharge.  Dr. Sallye Ober. 07/03/2020.

## 2020-07-01 NOTE — H&P (Signed)
HPI: 30 y.o. G1P0 @ [redacted]w[redacted]d estimated gestational age (as dated by LMP c/w 7 week ultrasound) is admitted for induction of labor for decreased fetal movement and non-reactive NST.  Patient reports some decrease in movement which she felt was normal and did not have concern regarding the decrease. NST in the office non-reactive but reassuring. She desired to await natural labor by 41 weeks but ultimately in agreement with induction of labor now.  Leakage of fluid:  No Vaginal bleeding:  No Contractions:  No Fetal movement:  Decreased  Prenatal care has been provided by Dr. Steva Ready [facility])  ROS:  Denies fevers, chills, chest pain, visual changes, SOB, RUQ/epigastric pain, N/V, dysuria, hematuria, or sudden onset/worsening bilateral LE or facial edema.  Pregnancy complicated by: Mitral valve prolapse with moderate regurgitation and mild left atrial enlargement Size greater than dates this pregnancy (normal growth [place]) Covid-19 this pregnancy, resolved (12/2019)   Prenatal Transfer Tool  Maternal Diabetes: No Genetic Screening: Normal Maternal Ultrasounds/Referrals: Normal Fetal Ultrasounds or other Referrals:  None Maternal Substance Abuse:  No Significant Maternal Medications:  None Significant Maternal Lab Results: Group B Strep positive   Prenatal Labs Blood type:  A Positive Antibody screen:  Negative CBC:  H/H 10.5/31 Rubella: Immune RPR:  Non-reactive Hep B:  Negative Hep C:  Negative HIV:  Negative GC/CT:  Negative Glucola: 116 (wnl)  Immunizations: Tdap: Given prenatally Flu: Received 12/28/20  OBHx:  OB History     Gravida  1   Para      Term      Preterm      AB      Living         SAB      IAB      Ectopic      Multiple      Live Births             PMHx:  See above Meds:  PNV Allergy:  No Known Allergies SurgHx: None SocHx:   Denies Tobacco, ETOH, illicit drugs  O: There were no vitals taken for this visit. Gen.  AAOx3, NAD CV.  RRR  Resp. CTAB, no wheezes/rales/rhonchi Abd. Gravid, soft, non-tender throughout, no rebound/guarding Extr.  Trace bilateral LE edema, no calf tenderness bilaterally SVE: 3-4/80/high, cervix posterior cephalic by sutures  Last [place] (6/62/94):  [redacted]w[redacted]d EFW 3578g (68%) AAFV, Cephalic, Anterior placenta    FHT: 145-150 baseline, moderate variability, - accels,  - decels Toco: none   Labs: see orders  A/P:  30 y.o. G1P0 @ [redacted]w[redacted]d admitted for induction of labor for decreased FM and non-reactive NST.   IOL - non-reactive NST, decreased FM - Admit to L&D - Admit labs (CBC, T&S, COVID screen) - CEFM/Toco - FWB:  Reassuring, but not reactive in office on NST - Diet:  Clear liquids - IVF:  SLIV - patient does not desire routine IVFs running, okay with carrier fluids PRN - VTE Prophylaxis:  SCDs - GBS Status:  Positive - PCN ordered - Presentation:  Cephalic by sutures - Pain control:  Per patient request, does not desire pain medications - Induction method:  Pitocin 2x2  Mitral valve prolapse with moderate regurgitation and mild left atrial enlargement - Has history of palpitations in the past but asymptomatic majority of this pregnancy - Cardiologist: [facility] - Last Echo (09/02/19): EF 60-65%, left ventricle has normal function - Cardiology plans to evaluate patient postpartum, recommends routine care  Size greater than dates this pregnancy (normal growth  [place]) - Last growth 3578g (68%) on 6/10  COVID-19 this pregnancy, resolved (12/2019) - Status post COVID vaccine  Steva Ready, DO [phone] (office)

## 2020-07-02 ENCOUNTER — Encounter (HOSPITAL_COMMUNITY): Payer: Self-pay

## 2020-07-02 ENCOUNTER — Inpatient Hospital Stay (HOSPITAL_COMMUNITY)
Admission: AD | Admit: 2020-07-02 | Discharge: 2020-07-04 | DRG: 805 | Disposition: A | Discharge status: Home / Self Care | Payer: Managed Care, Other (non HMO) | Attending: Obstetrics and Gynecology | Admitting: Obstetrics and Gynecology

## 2020-07-02 ENCOUNTER — Other Ambulatory Visit: Payer: Self-pay

## 2020-07-02 DIAGNOSIS — O368130 Decreased fetal movements, third trimester, not applicable or unspecified: Secondary | ICD-10-CM

## 2020-07-02 DIAGNOSIS — Z8616 Personal history of COVID-19: Secondary | ICD-10-CM | POA: Diagnosis not present

## 2020-07-02 DIAGNOSIS — Z3A39 39 weeks gestation of pregnancy: Secondary | ICD-10-CM

## 2020-07-02 DIAGNOSIS — Z20822 Contact with and (suspected) exposure to covid-19: Secondary | ICD-10-CM | POA: Diagnosis present

## 2020-07-02 DIAGNOSIS — O99824 Streptococcus B carrier state complicating childbirth: Secondary | ICD-10-CM | POA: Diagnosis present

## 2020-07-02 DIAGNOSIS — I341 Nonrheumatic mitral (valve) prolapse: Secondary | ICD-10-CM | POA: Diagnosis present

## 2020-07-02 DIAGNOSIS — O26893 Other specified pregnancy related conditions, third trimester: Secondary | ICD-10-CM | POA: Diagnosis present

## 2020-07-02 DIAGNOSIS — O9942 Diseases of the circulatory system complicating childbirth: Secondary | ICD-10-CM | POA: Diagnosis present

## 2020-07-02 LAB — CBC
HCT: 35.7 % — ABNORMAL LOW (ref 36.0–46.0)
Hemoglobin: 11.3 g/dL — ABNORMAL LOW (ref 12.0–15.0)
MCH: 27.7 pg (ref 26.0–34.0)
MCHC: 31.7 g/dL (ref 30.0–36.0)
MCV: 87.5 fL (ref 80.0–100.0)
Platelets: 281 10*3/uL (ref 150–400)
RBC: 4.08 MIL/uL (ref 3.87–5.11)
RDW: 16.4 % — ABNORMAL HIGH (ref 11.5–15.5)
WBC: 11.6 10*3/uL — ABNORMAL HIGH (ref 4.0–10.5)
nRBC: 0 % (ref 0.0–0.2)

## 2020-07-02 LAB — TYPE AND SCREEN
ABO/RH(D): A POS
Antibody Screen: NEGATIVE

## 2020-07-02 LAB — RESP PANEL BY RT-PCR (FLU A&B, COVID) ARPGX2
Influenza A by PCR: NEGATIVE
Influenza B by PCR: NEGATIVE
SARS Coronavirus 2 by RT PCR: NEGATIVE

## 2020-07-02 MED ORDER — OXYTOCIN BOLUS FROM INFUSION: 333.0000 mL | Freq: Once | INTRAVENOUS | Status: DC

## 2020-07-02 MED ORDER — ACETAMINOPHEN 325 MG PO TABS: 650.0000 mg | ORAL_TABLET | ORAL | Status: DC | PRN

## 2020-07-02 MED ORDER — OXYCODONE-ACETAMINOPHEN 5-325 MG PO TABS
2.0000 | ORAL_TABLET | ORAL | Status: DC | PRN
Start: 2020-07-02 — End: 2020-07-02

## 2020-07-02 MED ORDER — SENNOSIDES-DOCUSATE SODIUM 8.6-50 MG PO TABS
2.0000 | ORAL_TABLET | ORAL | Status: DC
  Administered 2020-07-04: 2 via ORAL
  Filled 2020-07-02 (×2): qty 2

## 2020-07-02 MED ORDER — SIMETHICONE 80 MG PO CHEW: 80.0000 mg | CHEWABLE_TABLET | ORAL | Status: DC | PRN

## 2020-07-02 MED ORDER — BENZOCAINE-MENTHOL 20-0.5 % EX AERO
1.0000 "application " | INHALATION_SPRAY | CUTANEOUS | Status: DC | PRN
  Administered 2020-07-03: 1 via TOPICAL
  Filled 2020-07-02: qty 56

## 2020-07-02 MED ORDER — SODIUM CHLORIDE 0.9 % IV SOLN: 1.0000 g | INTRAVENOUS | Status: DC

## 2020-07-02 MED ORDER — ONDANSETRON HCL 4 MG PO TABS: 4.0000 mg | ORAL_TABLET | ORAL | Status: DC | PRN

## 2020-07-02 MED ORDER — PRENATAL MULTIVITAMIN CH
1.0000 | ORAL_TABLET | Freq: Every day | ORAL | Status: DC
  Administered 2020-07-03 – 2020-07-04 (×2): 1 via ORAL
  Filled 2020-07-02 (×2): qty 1

## 2020-07-02 MED ORDER — LACTATED RINGERS IV SOLN: INTRAVENOUS | Status: DC

## 2020-07-02 MED ORDER — DIBUCAINE (PERIANAL) 1 % EX OINT
1.0000 "application " | TOPICAL_OINTMENT | Freq: Four times a day (QID) | CUTANEOUS | Status: DC
  Administered 2020-07-02: 1 via RECTAL
  Filled 2020-07-02 (×3): qty 28

## 2020-07-02 MED ORDER — IBUPROFEN 600 MG PO TABS
600.0000 mg | ORAL_TABLET | Freq: Four times a day (QID) | ORAL | Status: DC
  Administered 2020-07-02 – 2020-07-04 (×7): 600 mg via ORAL
  Filled 2020-07-02 (×6): qty 1

## 2020-07-02 MED ORDER — DIPHENHYDRAMINE HCL 25 MG PO CAPS: 25.0000 mg | ORAL_CAPSULE | Freq: Four times a day (QID) | ORAL | Status: DC | PRN

## 2020-07-02 MED ORDER — OXYCODONE-ACETAMINOPHEN 5-325 MG PO TABS: 1.0000 | ORAL_TABLET | ORAL | Status: DC | PRN

## 2020-07-02 MED ORDER — SODIUM CHLORIDE 0.9 % IV SOLN
2.0000 g | Freq: Once | INTRAVENOUS | Status: AC
  Administered 2020-07-02: 2 g via INTRAVENOUS

## 2020-07-02 MED ORDER — ONDANSETRON HCL 4 MG/2ML IJ SOLN: 4.0000 mg | Freq: Four times a day (QID) | INTRAMUSCULAR | Status: DC | PRN

## 2020-07-02 MED ORDER — OXYTOCIN-SODIUM CHLORIDE 30-0.9 UT/500ML-% IV SOLN
2.5000 [IU]/h | INTRAVENOUS | Status: DC
  Filled 2020-07-02: qty 500

## 2020-07-02 MED ORDER — FLEET ENEMA 7-19 GM/118ML RE ENEM: 1.0000 | ENEMA | RECTAL | Status: DC | PRN

## 2020-07-02 MED ORDER — COCONUT OIL OIL: 1.0000 "application " | TOPICAL_OIL | Status: DC | PRN

## 2020-07-02 MED ORDER — ONDANSETRON HCL 4 MG/2ML IJ SOLN: 4.0000 mg | INTRAMUSCULAR | Status: DC | PRN

## 2020-07-02 MED ORDER — LACTATED RINGERS IV SOLN: 500.0000 mL | INTRAVENOUS | Status: DC | PRN

## 2020-07-02 MED ORDER — LIDOCAINE HCL (PF) 1 % IJ SOLN
30.0000 mL | INTRAMUSCULAR | Status: DC | PRN
  Administered 2020-07-02: 30 mL via SUBCUTANEOUS
  Filled 2020-07-02: qty 30

## 2020-07-02 MED ORDER — WITCH HAZEL-GLYCERIN EX PADS
1.0000 "application " | MEDICATED_PAD | Freq: Four times a day (QID) | CUTANEOUS | Status: DC
  Administered 2020-07-02 (×2): 1 via TOPICAL

## 2020-07-02 MED ORDER — SOD CITRATE-CITRIC ACID 500-334 MG/5ML PO SOLN: 30.0000 mL | ORAL | Status: DC | PRN

## 2020-07-02 MED ORDER — TETANUS-DIPHTH-ACELL PERTUSSIS 5-2.5-18.5 LF-MCG/0.5 IM SUSY: 0.5000 mL | PREFILLED_SYRINGE | Freq: Once | INTRAMUSCULAR | Status: DC

## 2020-07-02 MED ORDER — OXYTOCIN 10 UNIT/ML IJ SOLN
INTRAMUSCULAR | Status: AC
  Administered 2020-07-02: 10 [IU]
  Filled 2020-07-02: qty 1

## 2020-07-02 NOTE — Lactation Note (Signed)
This note was copied from a baby's chart. Lactation Consultation Note  Patient Name: Alicia Kelley [id number] Date: 07/02/2020 Reason for consult: Initial assessment;1st time breastfeeding;Primapara;Term Age:28 hours  Initial visit to 4 hours old infant of a P1 mother. Parents and support person are present upon arrival. [facility] assisted with hand expression and colostrum is easily expressed. Breast tissue is very pliable, nipples are everted. LC placed infant skin to skin per mother's request. Plan: 1-Skin to skin 2-Aim for a deep, comfortable latch 3-Breastfeeding on demand or 8-12 times in 24h period. 4-Keep infant awake during breastfeeding session: massaging breast, infant's hand/shoulder/feet 5-Monitor voids and stools as signs good intake.  6-Encouraged maternal rest, hydration and food intake.  7-Contact LC as needed for feeds/support/concerns/questions   All questions answered at this time. Provided Lactation services brochure and promoted [employer] booklet information.    Maternal Data Has patient been taught Hand Expression?: Yes Does the patient have breastfeeding experience prior to this delivery?: No  Feeding Mother's Current Feeding Choice: Breast Milk  LATCH Score Latch: Repeated attempts needed to sustain latch, nipple held in mouth throughout feeding, stimulation needed to elicit sucking reflex.  Audible Swallowing: None  Type of Nipple: Everted at rest and after stimulation  Comfort (Breast/Nipple): Soft / non-tender  Hold (Positioning): Full assist, staff holds infant at breast  LATCH Score: 5   Lactation Tools Discussed/Used Tools: Pump;Flanges Flange Size: 24 Breast pump type: Manual Pump Education: Milk Storage;Setup, frequency, and cleaning Reason for Pumping: stimulation and supplementation Pumping frequency: as neded Pumped volume:  (has not started yet, demonstrated use)  Interventions Interventions: Breast feeding basics  reviewed;Education;Expressed milk;Skin to skin;Breast massage;Hand express;Hand pump  Discharge Pump: Manual WIC Program: No  Consult Status Consult Status: Follow-up Date: 07/03/20 Follow-up type: In-patient    Milaya Hora A Higuera Ancidey 07/02/2020, 3:42 PM

## 2020-07-02 NOTE — H&P (Signed)
HPI: 30 y.o. G1P0 @ [redacted]w[redacted]d estimated gestational age (as dated by LMP c/w 7 week ultrasound) is admitted for active labor     Leakage of fluid:  No Vaginal bleeding:  No Contractions: Yes Fetal movement:  Active as perceived by pt   Prenatal care has been provided by Dr. Steva Ready [facility])   ROS:  Denies fevers, chills, chest pain, visual changes, SOB, RUQ/epigastric pain, N/V, dysuria, hematuria, or sudden onset/worsening bilateral LE or facial edema.   Pregnancy complicated by: Mitral valve prolapse with moderate regurgitation and mild left atrial enlargement Size greater than dates this pregnancy (normal growth [place]) Covid-19 this pregnancy, resolved (12/2019)     Prenatal Transfer Tool  Maternal Diabetes: No Genetic Screening: Normal Maternal Ultrasounds/Referrals: Normal Fetal Ultrasounds or other Referrals:  None Maternal Substance Abuse:  No Significant Maternal Medications:  None Significant Maternal Lab Results: Group B Strep positive     Prenatal Labs Blood type:  A Positive Antibody screen:  Negative CBC:  H/H 10.5/31 Rubella: Immune RPR:  Non-reactive Hep B:  Negative Hep C:  Negative HIV:  Negative GC/CT:  Negative Glucola: 116 (wnl)   Immunizations: Tdap: Given prenatally Flu: Received 12/28/20   OBHx:  OB History       Gravida  1   Para      Term      Preterm      AB      Living           SAB      IAB      Ectopic      Multiple      Live Births                PMHx:  See above Meds:  PNV Allergy:  No Known Allergies SurgHx: None SocHx:   Denies Tobacco, ETOH, illicit drugs   O:  Vitals with BMI 07/02/2020 07/01/2020 07/01/2020  Height 5\' 8"  - -  Weight 210 lbs - -  BMI 31.94 - -  Systolic 136 125  Diastolic 66 60 70  Pulse 79 72 75    Gen. AAOx3, NAD CV.  RRR Resp. CTAB, no wheezes/rales/rhonchi Abd. Gravid, soft, non-tender throughout, no rebound/guarding Extr.  Trace bilateral LE edema, no calf  tenderness bilaterally SVE: 3-4/80/high, cervix posterior cephalic by sutures   Last 05-30-1975 (07/01/20):  [redacted]w[redacted]d EFW 3578g (68%) AAFV, Cephalic, Anterior placenta      FHT: 145-150 baseline, moderate variability, accels present, decels absent Toco: 2-4 Dilation: 8 Effacement (%): 100 Cervical Position: [place]: 0 Presentation: Vertex Exam by:: C Neil CNM      Labs:  CBC    Component Value Date/Time   WBC 11.6 (H) 07/02/2020 0947   RBC 4.08 07/02/2020 0947   HGB 11.3 (L) 07/02/2020 0947   HCT 35.7 (L) 07/02/2020 0947   PLT 281 07/02/2020 0947   MCV 87.5 07/02/2020 0947   MCH 27.7 07/02/2020 0947   MCHC 31.7 07/02/2020 0947   RDW 16.4 (H) 07/02/2020 0947      A/P:  30 y.o. G1P0 @ [redacted]w[redacted]d   Active labor  Cat 1 GBS pos    -Ampicillin for advance dilation Pain management: Plans unmedicated labor and birth  Hx of Mitral valve prolapse with moderate regurgitation and mild left atrial enlargement - Has history of palpitations in the past but asymptomatic majority of this pregnancy - Cardiologist: [facility] - Last Echo (09/02/19): EF 60-65%, left ventricle has normal function - Cardiology plans  to evaluate patient postpartum, recommends routine care   Size greater than dates this pregnancy (normal growth [place]) - Last growth 3578g (68%) on 6/10   COVID-19 this pregnancy, resolved (12/2019) - Status post COVID vaccine  Rhea Pink, MSN, CNM 07/02/2020 10:47 AM

## 2020-07-03 LAB — CBC
HCT: 31.1 % — ABNORMAL LOW (ref 36.0–46.0)
Hemoglobin: 10.2 g/dL — ABNORMAL LOW (ref 12.0–15.0)
MCH: 28 pg (ref 26.0–34.0)
MCHC: 32.8 g/dL (ref 30.0–36.0)
MCV: 85.4 fL (ref 80.0–100.0)
Platelets: 246 10*3/uL (ref 150–400)
RBC: 3.64 MIL/uL — ABNORMAL LOW (ref 3.87–5.11)
RDW: 16.4 % — ABNORMAL HIGH (ref 11.5–15.5)
WBC: 17.5 10*3/uL — ABNORMAL HIGH (ref 4.0–10.5)
nRBC: 0 % (ref 0.0–0.2)

## 2020-07-03 LAB — RPR: RPR Ser Ql: NONREACTIVE

## 2020-07-03 NOTE — Progress Notes (Addendum)
Subjective: Postpartum Day # 1 : S/P NSVD due to pt was admitted on 6/11 for active labor at 8 cm at 39.6, progressed natural without epidural as desired, no augmention, SVD on 6/11 over small 1st degree episiotomy right medio-lateral, ebl of , hgb 11.3-10.2, asymptomatic. H/O MVP cleared by cards in [place], echo last august EF >65%, covid + Dec 2021, GBS+ x1 dose amp given during labor, unable to received second dose due to radip delivery. Baby female in pt circ to be done tomorrow. Patient up ad lib, denies syncope or dizziness. Reports consuming regular diet without issues and denies N/V. Patient reports 0 bowel movement + passing flatus.  Denies issues with urination and reports bleeding is "liighter."  Patient is breastfeeding and reports going well.  Desires mini pill for postpartum contraception.  Pain is being appropriately managed with use of po meds.   1st degree episiotomy laceration repaired Feeding:  Breast Contraceptive plan:  mini pill 6 weeks PP BB: Circ in pt tomorrow.   Objective: Vital signs in last 24 hours: Patient Vitals for the past 24 hrs:  BP Temp Temp src Pulse Resp SpO2  07/03/20 0525 126/60 98.4 F (36.9 C) Oral 82 16 100 %  07/02/20 2331 117/65 98.1 F (36.7 C) Oral 77 18 100 %  07/02/20 1841 124/63 98.5 F (36.9 C) Oral 83 18 100 %  07/02/20 1502 125/72 98.1 F (36.7 C) Oral 67 18 98 %  07/02/20 1401 124/62 98.3 F (36.8 C) Oral 70 18 100 %  07/02/20 1331 (!) 123/59 -- -- 73 20 --  07/02/20 1318 (!) 125/55 -- -- 73 18 --  07/02/20 1246 (!) 115/53 -- -- 86 20 --  07/02/20 1231 112/64 -- -- 89 18 --  07/02/20 1224 121/64 -- -- 82 18 --  07/02/20 1201 119/68 -- -- 81 18 --  07/02/20 1159 (!) 109/56 -- -- 84 -- --     Physical Exam:  General: alert, cooperative, and no distress Mood/Affect: Happy Lungs: clear to auscultation, no wheezes, rales or rhonchi, symmetric air entry.  Heart: normal rate, regular rhythm, normal S1, S2, no murmurs, rubs,  clicks or gallops. Breast: breasts appear normal, no suspicious masses, no skin or nipple changes or axillary nodes. Abdomen:  + bowel sounds, soft, non-tender GU: perineum approximate, healing well. No signs of external hematomas.  Uterine Fundus: firm Lochia: appropriate Skin: Warm, Dry. DVT Evaluation: No evidence of DVT seen on physical exam. Negative Homan's sign. No cords or calf tenderness. No significant calf/ankle edema.  CBC Latest Ref Rng & Units 07/03/2020 07/02/2020  WBC 4.0 - 10.5 K/uL 17.5(H) 11.6(H)  Hemoglobin 12.0 - 15.0 g/dL 10.2(L) 11.3(L)  Hematocrit 36.0 - 46.0 % 31.1(L) 35.7(L)  Platelets 150 - 400 K/uL 246 281    Results for orders placed or performed during the hospital encounter of 07/02/20 (from the past 24 hour(s))  CBC     Status: Abnormal   Collection Time: 07/03/20  4:39 AM  Result Value Ref Range   WBC 17.5 (H) 4.0 - 10.5 K/uL   RBC 3.64 (L) 3.87 - 5.11 MIL/uL   Hemoglobin 10.2 (L) 12.0 - 15.0 g/dL   HCT 60.7 (L) 37.1 - 06.2 %   MCV 85.4 80.0 - 100.0 fL   MCH 28.0 26.0 - 34.0 pg   MCHC 32.8 30.0 - 36.0 g/dL   RDW 69.4 (H) 85.4 - 62.7 %   Platelets 246 150 - 400 K/uL   nRBC 0.0 0.0 - 0.2 %  CBG (last 3)  No results for input(s): GLUCAP in the last 72 hours.   I/O last 3 completed shifts: In: -  Out: 350 [Blood:350]   Assessment Postpartum Day # 1 : S/P NSVD due to pt was admitted on 6/11 for active labor at 8 cm at 39.6, progressed natural without epidural as desired, no augmention, SVD on 6/11 over small 1st degree episiotomy right medio-lateral, ebl of , hgb 11.3-10.2, asymptomatic. H/O MVP cleared by cards in [place], echo last august EF >65%, covid + Dec 2021, GBS+ x1 dose amp given during labor, unable to received second dose due to radip delivery. Baby female in pt circ to be done tomorrow. Pt stable. -1 involution. Breast feeding. Hemodynamically stable.   Plan: Continue other mgmt as ordered MVP: F/U with [place]  cardiology PP PRN.  VTE prophylactics: Early ambulated as tolerates.  Pain control: Motrin/Tylenol PRN Education given regarding options for contraception, including barrier methods, injectable contraception, IUD placement, oral contraceptives.  Plan for discharge tomorrow, Breastfeeding, Lactation consult, Circumcision prior to discharge, and Contraception mini pill at 6 weeks PP  Dr. Sallye Ober to be updated on patient status  [facility] NP-C, CNM 07/03/2020, 11:35 AM

## 2020-07-03 NOTE — Lactation Note (Signed)
This note was copied from a baby's chart. Lactation Consultation Note  Patient Name: [name] [id number] Date: 07/03/2020 Reason for consult: Follow-up assessment Age:28 Hours Staff nurse paged for [name] to assist mother with latch. Infant purses his lips and want open his mouth.  Infant sucks on his lips and finger. Multiple attempts to get infant to latch and open his mouth wide. Infant placed in several positions and attempt to offer each breast. No sustained latch.  Infant bits on gloved finger. Advised parents in suck training.   Mother reports that she doesn't think any of his feedings have been good feedings.  Assist mother with hand expression. Unable to get more than a few tiny drops of colostrum.  Mother has a hand pump and advised her to pump 15 mins on each breast.   Staff nurse to sat up DEBP for mother.   Suggested that mother offer supplement to infant. If infant doesn't breast feed after next feeding attempt.  Suggested option of DBM or  formula for supplement .  Mother to inform staff nurse which supplement she plans to offer.  Suggest that mother continue to do hand expression and keep infant STS.   Maternal Data Has patient been taught Hand Expression?: Yes  Feeding Mother's Current Feeding Choice: Breast Milk  LATCH Score                    Lactation Tools Discussed/Used Breast pump type: Double-Electric Breast Pump  Interventions Interventions: Assisted with latch;Hand express;Adjust position;Breast compression;Support pillows;Expressed milk;Hand pump;DEBP  Discharge    Consult Status      Alicia Kelley [facility] 07/03/2020, 4:29 PM

## 2020-07-03 NOTE — Progress Notes (Signed)
Pt refusing any supplementation, donor milk or formula, at this time. Educated pt and support person on risks and benefits of supplementation. Pt states she understands and does not want to supplement 

## 2020-07-03 NOTE — Lactation Note (Signed)
This note was copied from a baby's chart. Lactation Consultation Note  Patient Name: Alicia Kelley Date: 07/03/2020 Reason for consult: Follow-up assessment;Mother's request;Difficult latch;1st time breastfeeding;Term Age:28 hours Mom having difficulty with latching infant at the breast, infant is not sustaining latch and he sucks his top lip inward. LC did suck training with infant flanging his top and bottom lip outward. LC reviewed hand expression and infant was given 3.5 mls of colostrum by spoon. Mom made multiple attempts to latch infant at the breast using the football hold position, but infant would not sustain latch. LC observed mom is semi short shafted, due to infant not sustaining latch mom fitted with 20 mm NS. Mom re-latched infant  using 20 mm NS that was pre-filled with 0.5 mls of colostrum and infant sustained latch and BF for 17 minutes and appeared content after the feeding, colostrum was present in NS after the feeding.  Per mom, she is glad infant latching at the breast now, she was getting frustrated and had decided to formula feed infant. Mom was using the DEBP as LC left the room. Mom's plan: 1- Mom will breastfeed infant according to feeding cues, 8 to 12+ times within 24 hours,STS. 2- Prior to latching infant mom will use hand pump to evert nipple shaft out more, then apply 20 mm NS and latch infant at the breast. 3- Mom knows to call RN or LC if she has any BF questions, concerns or needs assistance with latching infant at the breast. 4- Mom will continue to use DEBP every 3 hours for 15 minutes on initial setting, and she will give infant any EBM that she has pumped.  5- After 24 hours mom will attempt to latch infant without 20 mm NS 6- If needed, dad will assist with lowering infants jaw and flanging infant's top lip out when mom latches infant at the breast.  Maternal Data Has patient been taught Hand Expression?: Yes Does the patient have  breastfeeding experience prior to this delivery?: No  Feeding Mother's Current Feeding Choice: Breast Milk  LATCH Score Latch: Grasps breast easily, tongue down, lips flanged, rhythmical sucking.  Audible Swallowing: A few with stimulation  Type of Nipple: Everted at rest and after stimulation (short shafted)  Comfort (Breast/Nipple): Soft / non-tender  Hold (Positioning): Assistance needed to correctly position infant at breast and maintain latch.  LATCH Score: 8   Lactation Tools Discussed/Used Tools: Nipple Dorris Carnes (Mom is slightly short shafted.) Nipple shield size: 20  Interventions Interventions: Assisted with latch;Breast compression;Adjust position;Support pillows;Position options;Expressed milk;DEBP;Education  Discharge    Consult Status Consult Status: Follow-up Date: 07/04/20 Follow-up type: In-patient    Alicia Kelley 07/03/2020, 6:50 PM

## 2020-07-04 MED ORDER — IBUPROFEN 800 MG PO TABS: 800.0000 mg | ORAL_TABLET | Freq: Three times a day (TID) | ORAL | 0 refills | Status: DC | PRN

## 2020-07-04 NOTE — Lactation Note (Signed)
This note was copied from a baby's chart. Lactation Consultation Note  Patient Name: Alicia Kelley [id number] Date: 07/04/2020 Reason for consult: Follow-up assessment Age:28 hours  P1 mother whose infant is now 67 hours old.  This is a term baby at 39+6 weeks.  Reviewed feeding plan for after discharge.  Mother concerned that baby is not awakening since his circumcision at 0730.  Reassured her that this is typical behavior after circumcision.  Mother had approximately 5 mls of EBM at bedside.  Demonstrated feeding with the curved tip syringe and baby easily consumed this volume.  He started to awaken and I attempted to assist with latching to the left breast in the football hold.  Baby would latch but showed no interest in sucking.  Demonstrated swaddling and placed him in the bassinet.  Observed mother using her manual pump.  Changed flange size to a #30 for greater fit and comfort.  Mother started obtaining EBM; she was pleased.  Parents will feed back any EBM mother obtains prior to discharge.    Mother's DEBP has shipped and should arrive soon.  Family had no further questions/concerns.  Discussed the possibility of baby awakening more tonight to feed often.  Suggested adequate rest during the day today.  Family appreciative.   Maternal Data    Feeding    LATCH Score                    Lactation Tools Discussed/Used    Interventions Interventions: Education  Discharge Discharge Education: Engorgement and breast care Pump: DEBP;Manual;Personal  Consult Status Consult Status: Complete Date: 07/04/20 Follow-up type: Call as needed    Alicia Kelley 07/04/2020, 10:46 AM

## 2020-07-04 NOTE — Discharge Summary (Signed)
Postpartum Discharge Summary  Date of Service: 07/04/20      Patient Name: Alicia Kelley DOB: [birth year] MRN: [id number]  Date of admission: 07/02/2020 Delivery date:07/02/2020  Delivering provider: Burman Foster B  Date of discharge: 07/04/2020  Admitting diagnosis: Normal labor [O80, Z37.9] Intrauterine pregnancy: [redacted]w[redacted]d    Secondary diagnosis:  Active Problems:   Normal labor   SVD (spontaneous vaginal delivery)   Normal postpartum course  Additional problems: Mitral valve prolapse with moderate regurgitation and mild left atrial enlargement (stable, no medications), Size greater than dates (normal growth ultrasounds), COVID-19 this pregnancy (resolved)    Discharge diagnosis: Term Pregnancy Delivered                                              Post partum procedures: None Augmentation: N/A Complications: None  Hospital course: Onset of Labor With Vaginal Delivery      30y.o. yo G1P1001 at 357w6das admitted in Active Labor on 07/02/2020. Patient had an uncomplicated labor course as follows:  Membrane Rupture Time/Date: 11:20 AM ,07/02/2020   Delivery Method:Vaginal, Spontaneous  Episiotomy: Right Mediolateral  Lacerations:  None  Patient had an uncomplicated postpartum course.  She is ambulating, tolerating a regular diet, passing flatus, and urinating well. Prior to discharge, patient and her husband consented for infant circumcision - risks of bleeding, infection, and poor cosmesis reviewed in full. Patient is discharged home in stable condition on 07/04/20.  Newborn Data: Birth date:07/02/2020  Birth time:11:37 AM  Gender:Female  Living status:Living  Apgars:8 ,9  Weight:3610 g   Magnesium Sulfate received: No BMZ received: No Rhophylac:N/A MMR:N/A T-DaP:Given prenatally Flu: Yes Transfusion:No  Physical exam  Vitals:   07/03/20 0525 07/03/20 1403 07/03/20 2000 07/04/20 0611  BP: 126/60 121/70 114/69 120/73  Pulse: 82 78 75 67  Resp: '16 14 16 16  ' Temp: 98.4 F  (36.9 C) 98.2 F (36.8 C) 98.5 F (36.9 C) 98.1 F (36.7 C)  TempSrc: Oral Oral Oral Oral  SpO2: 100%  100% 100%  Weight:      Height:       General: alert, cooperative, and no distress Cardio:  RRR Lungs: CTAB, no wheezes/rales/rhonchi Lochia: appropriate Uterine Fundus: firm Incision: N/A DVT Evaluation: No evidence of DVT seen on physical exam. No cords or calf tenderness. Labs: Lab Results  Component Value Date   WBC 17.5 (H) 07/03/2020   HGB 10.2 (L) 07/03/2020   HCT 31.1 (L) 07/03/2020   MCV 85.4 07/03/2020   PLT 246 07/03/2020   No flowsheet data found. Edinburgh Score: Edinburgh Postnatal Depression Scale Screening Tool 07/03/2020  I have been able to laugh and see the funny side of things. 0  I have looked forward with enjoyment to things. 0  I have blamed myself unnecessarily when things went wrong. 1  I have been anxious or worried for no good reason. 1  I have felt scared or panicky for no good reason. 2  Things have been getting on top of me. 0  I have been so unhappy that I have had difficulty sleeping. 0  I have felt sad or miserable. 0  I have been so unhappy that I have been crying. 0  The thought of harming myself has occurred to me. 0  Edinburgh Postnatal Depression Scale Total 4      After visit meds:  Allergies as of  07/04/2020   No Known Allergies      Medication List     TAKE these medications    folic acid 1 MG tablet Commonly known as: FOLVITE Take 1 mg by mouth daily. 1 tablet Daily   ibuprofen 800 MG tablet Commonly known as: ADVIL Take 1 tablet (800 mg total) by mouth every 8 (eight) hours as needed.   metoprolol tartrate 25 MG tablet Commonly known as: LOPRESSOR Take one-half to one tablet by mouth twice daily as needed for palpitations   Pre-Natal Formula Tabs Take 2 tablets by mouth. 12 tablets daily         Discharge home in stable condition Infant Feeding: Breast Infant Disposition:home with mother Discharge  instruction: per After Visit Summary and Postpartum booklet. Activity: Advance as tolerated. Pelvic rest for 6 weeks.  Diet: routine diet Anticipated Birth Control:  None at this time - will discuss further at 6 week postpartum visit Postpartum Appointment:6 weeks Additional Postpartum F/U:  None Future Appointments:No future appointments. Follow up Visit:  Follow-up Information     Drema Dallas, DO Follow up in 6 week(s).   Specialty: Obstetrics and Gynecology Why: Please keep your previously scheduled 6 week postpartum visit with Dr. Delora Fuel. Contact information: [address] [place] [address] [phone]                     07/04/2020 Drema Dallas, DO

## 2020-07-13 ENCOUNTER — Telehealth: Payer: Self-pay | Admitting: Internal Medicine

## 2020-07-13 NOTE — Telephone Encounter (Signed)
Returned call to patient who states her BP has been elevated over the past few days. She is 11 days postpartum normal vaginal delivery. Reports systolic BP is 130's mmHg, diastolic 80-89 mmHg. Normal BP for her prior to delivery was 112/60 mmHg. Pulse has been 65-75 bpm and she denies palpitations. She was previously taking metoprolol tartrate for hx of palpitations and paroxysmal atrial flutter or atrial tach. She stopped her medications during her pregnancy and has not resumed metoprolol. Denies SOB, chest pain, palpitations, edema. She admits to abnormal sleep schedule due to breastfeeding her newborn and lack of physical activity since delivery. She is trying to limit sodium in diet. I advised that she should consult with her lactation consultant prior to resuming metoprolol while breastfeeding. Advised that she can continue to monitor BP and HR for a little longer prior to resuming metoprolol due to proximity of delivery and associated sleep disturbance and lack of physical activity. I scheduled her to see Edd Fabian, NP on 8/5 and advised her to call back prior to visit if her BP remains elevated >130/80. Advised also that she call back if she develops tachycardia, palpitations, or other concerns. She verbalized understanding and agreement and thanked me for the call.

## 2020-07-13 NOTE — Telephone Encounter (Signed)
Pt c/o BP issue: STAT if pt c/o blurred vision, one-sided weakness or slurred speech  1. What are your last 5 BP readings? 140/80 bottom number as high as 89  2. Are you having any other symptoms (ex. Dizziness, headache, blurred vision, passed out)? Headache and blurred vision after birth about 11 days ago  3. What is your BP issue? BP high

## 2020-08-26 ENCOUNTER — Ambulatory Visit: Payer: Managed Care, Other (non HMO) | Admitting: General Practice

## 2021-05-25 DIAGNOSIS — B3789 Other sites of candidiasis: Secondary | ICD-10-CM | POA: Diagnosis not present

## 2021-08-29 DIAGNOSIS — Z124 Encounter for screening for malignant neoplasm of cervix: Secondary | ICD-10-CM | POA: Diagnosis not present

## 2021-08-29 DIAGNOSIS — Z01419 Encounter for gynecological examination (general) (routine) without abnormal findings: Secondary | ICD-10-CM | POA: Diagnosis not present

## 2021-08-29 LAB — HM PAP SMEAR: HM Pap smear: NEGATIVE

## 2021-08-31 LAB — HM PAP SMEAR

## 2021-10-17 DIAGNOSIS — R1030 Lower abdominal pain, unspecified: Secondary | ICD-10-CM | POA: Diagnosis not present

## 2021-12-01 ENCOUNTER — Ambulatory Visit: Payer: BC Managed Care – PPO | Admitting: Family

## 2021-12-01 ENCOUNTER — Encounter: Payer: Self-pay | Admitting: Family

## 2021-12-01 VITALS — BP 108/60 | HR 96 | Temp 98.9°F | Ht 68.0 in | Wt 186.0 lb

## 2021-12-01 DIAGNOSIS — R5383 Other fatigue: Secondary | ICD-10-CM

## 2021-12-01 DIAGNOSIS — L309 Dermatitis, unspecified: Secondary | ICD-10-CM | POA: Diagnosis not present

## 2021-12-01 DIAGNOSIS — I340 Nonrheumatic mitral (valve) insufficiency: Secondary | ICD-10-CM

## 2021-12-01 DIAGNOSIS — I341 Nonrheumatic mitral (valve) prolapse: Secondary | ICD-10-CM | POA: Diagnosis not present

## 2021-12-01 MED ORDER — TRIAMCINOLONE ACETONIDE 0.1 % EX CREA: 1.0000 | TOPICAL_CREAM | Freq: Two times a day (BID) | CUTANEOUS | 0 refills | Status: DC

## 2021-12-01 NOTE — Progress Notes (Signed)
Alicia Kelley is a 31 y.o. female with the following history as recorded in Epic[employer]:  Patient Active Problem List   Diagnosis Date Noted   SVD (spontaneous vaginal delivery) 07/03/2020   Normal postpartum course 07/03/2020   Normal labor 07/02/2020   MVP (mitral valve prolapse) 02/01/2020   History of PAT (paroxysmal atrial tachycardia) 03/09/2015   Moderate mitral regurgitation 12/22/2014   Migraine 12/22/2014    Current Outpatient Medications  Medication Sig Dispense Refill   norethindrone (MICRONOR) 0.35 MG tablet Take 1 tablet by mouth daily.     triamcinolone cream (KENALOG) 0.1 % Apply 1 Application topically 2 (two) times daily. 453.6 g 0   No current facility-administered medications for this visit.    Allergies: Patient has no known allergies.  Past Medical History:  Diagnosis Date   H/O mitral valve prolapse    moderate regurgitation, mild left atrial enlargement   Mitral valve prolapse     Past Surgical History:  Procedure Laterality Date   NO PAST SURGERIES      Family History  Problem Relation Age of Onset   Hypertension Mother    Healthy Father    Heart murmur Sister    Healthy Brother     Social History   Tobacco Use   Smoking status: Never   Smokeless tobacco: Never  Substance Use Topics   Alcohol use: Never    Subjective:  Patient presents today as a new patient; has concerns about her skin on her hands; started in the last trimester of her pregnancy- symptoms x 1 year; limited relief with Hydrocortisone 2.5%; Using [employer]; trying to moisturize regularly-Aquaphor;      Objective:  Vitals:   12/01/21 1005  BP: 108/60  Pulse: 96  Temp: 98.9 F (37.2 C)  TempSrc: Oral  SpO2: 99%  Weight: 186 lb (84.4 kg)  Height: _0  (1.727 m)    General: Well developed, well nourished, in no acute distress  Skin : Warm and dry. Dry scaly patches noted on hand/ between fingers Head: Normocephalic and atraumatic  Eyes: Sclera and conjunctiva clear;  pupils round and reactive to light; extraocular movements intact  Ears: External normal; canals clear; tympanic membranes normal  Oropharynx: Pink, supple. No suspicious lesions  Neck: Supple without thyromegaly, adenopathy  Lungs: Respirations unlabored; clear to auscultation bilaterally without wheeze, rales, rhonchi  CVS exam: normal rate and regular rhythm, murmur noted Neurologic: Alert and oriented; speech intact; face symmetrical; moves all extremities well; CNII-XII intact without focal deficit   Assessment:  1. Other fatigue   2. Moderate mitral regurgitation   3. MVP (mitral valve prolapse)   4. Dermatitis     Plan:  Check CBC, CMP, TSH; discussed changing back to free and clear dish detergent; trial of Triamcinolone cream; trial of Gold Bond hand therapy especially at night; to consider referral to dermatology; Refer back to her cardiologist- has been almost 2 years since last OV; She will also plan to talk to her GYN about changing back to OCP with estrogen due to problematic breakthrough bleeding on Micronor.   No follow-ups on file.  Orders Placed This Encounter  Procedures   CBC with Differential/Platelet   Comp Met (CMET)   TSH   Ambulatory referral to Cardiology    Referral Priority:   Routine    Referral Type:   Consultation    Referral Reason:   Specialty Services Required    Referred to Provider:   Pixie Casino, MD    Requested  Specialty:   Cardiology    Number of Visits Requested:   1    Requested Prescriptions   Signed Prescriptions Disp Refills   triamcinolone cream (KENALOG) 0.1 % 453.6 g 0    Sig: Apply 1 Application topically 2 (two) times daily.

## 2021-12-01 NOTE — Patient Instructions (Signed)
Gold Bond Hand Therapy; switch back to your clear dish detergent

## 2021-12-02 LAB — COMPREHENSIVE METABOLIC PANEL
ALT: 12 IU/L (ref 0–32)
AST: 17 IU/L (ref 0–40)
Albumin/Globulin Ratio: 1.4 (ref 1.2–2.2)
Albumin: 4.6 g/dL (ref 4.0–5.0)
Alkaline Phosphatase: 82 IU/L (ref 44–121)
BUN/Creatinine Ratio: 19 (ref 9–23)
BUN: 15 mg/dL (ref 6–20)
Bilirubin Total: 0.3 mg/dL (ref 0.0–1.2)
CO2: 22 mmol/L (ref 20–29)
Calcium: 9.5 mg/dL (ref 8.7–10.2)
Chloride: 104 mmol/L (ref 96–106)
Creatinine, Ser: 0.81 mg/dL (ref 0.57–1.00)
Globulin, Total: 3.2 g/dL (ref 1.5–4.5)
Glucose: 77 mg/dL (ref 70–99)
Potassium: 4.2 mmol/L (ref 3.5–5.2)
Sodium: 139 mmol/L (ref 134–144)
Total Protein: 7.8 g/dL (ref 6.0–8.5)
eGFR: 101 mL/min/{1.73_m2} (ref >59)

## 2021-12-02 LAB — CBC WITH DIFFERENTIAL/PLATELET
Basophils Absolute: 0 10*3/uL (ref 0.0–0.2)
Basos: 1 %
EOS (ABSOLUTE): 0.1 10*3/uL (ref 0.0–0.4)
Eos: 1 %
Hematocrit: 39.7 % (ref 34.0–46.6)
Hemoglobin: 13.3 g/dL (ref 11.1–15.9)
Immature Grans (Abs): 0 10*3/uL (ref 0.0–0.1)
Immature Granulocytes: 0 %
Lymphocytes Absolute: 3.4 10*3/uL — ABNORMAL HIGH (ref 0.7–3.1)
Lymphs: 49 %
MCH: 29.4 pg (ref 26.6–33.0)
MCHC: 33.5 g/dL (ref 31.5–35.7)
MCV: 88 fL (ref 79–97)
Monocytes Absolute: 0.5 10*3/uL (ref 0.1–0.9)
Monocytes: 8 %
Neutrophils Absolute: 2.7 10*3/uL (ref 1.4–7.0)
Neutrophils: 41 %
Platelets: 335 10*3/uL (ref 150–450)
RBC: 4.53 x10E6/uL (ref 3.77–5.28)
RDW: 13.1 % (ref 11.7–15.4)
WBC: 6.7 10*3/uL (ref 3.4–10.8)

## 2021-12-02 LAB — TSH: TSH: 1.21 u[IU]/mL (ref 0.450–4.500)

## 2022-04-20 NOTE — Progress Notes (Unsigned)
Cardiology Clinic Note   Date: 04/23/2022 ID: Grindl Tawes, DOB 1990-11-05, MRN [id number]  Primary Cardiologist:  Pixie Casino, MD  Patient Profile    Alicia Kelley is a 32 y.o. female who presents to the clinic today for follow-up.  Past medical history significant for: PAT. Exercise tolerance test 01/13/2015: Good exercise tolerance, no chest pain, normal BP response, no ST changes, adequate ETT.  Patient appeared to be in a flutter versus ectopic atrial tachycardia on initial ECG; rhythm disturbance resolved with exercise and patient in sinus at end of study. MVP. Echo 09/02/2019: EF 60 to 65%.  Midsystolic MVP regurgitation.  Moderate MR.  Essentially unchanged from 2016. Migraines.    History of Present Illness    Alicia Kelley was first evaluated by Dr. Debara Pickett on 12/22/2014 for chest pain, shortness of breath, DOE, and palpitations.  Patient described a history of heart murmur detected as a child and underwent echo which describe what sounded like MVP.  She was seen by cardiologist in [place] whose name she cannot recall and underwent echo (records unavailable).  Repeat echo was ordered and showed normal LV function with thickening and bileaflet prolapse of the mitral valve with moderate regurgitation similar to echo performed at [facility] pediatric cardiology in 2014.  During exercise tolerance test patient appeared to be in a flutter versus ectopic atrial tachycardia on initial ECG (details above).  Alicia Kelley reestablish with Dr. Debara Pickett in July 2021 and was overall doing well except for increased palpitations for several week that she attributed to OT that had an increased amount of caffeine.  She was prescribed as needed metoprolol for persistent palpitations/tachycardia.  Patient was last seen in follow-up by Kerin Ransom, PA-C on 02/01/2020 via video visit.  At that time she was [redacted] weeks pregnant and instructed to remain off metoprolol for possible.  Today, patient is doing well. She has  no complaints today. She has established with a new PCP and she wanted her to be seen by cardiology to check on her valve issue. She reports she will only experience palpitations if she is under stress or has increased caffeine intake. Patient denies shortness of breath or dyspnea on exertion. No chest pain, pressure, or tightness. Denies lower extremity edema, orthopnea, or PND. No palpitations. 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 She has an almost 4 year old son. She does not do formal exercise but is trying to get back into a routine.    ROS: All other systems reviewed and are otherwise negative except as noted in History of Present Illness.  Studies Reviewed    ECG personally reviewed by me today: NSR, rate 74.  No significant changes from 08/19/2019.  Physical Exam    VS:  BP 124/74 (BP Location: Left Arm, Patient Position: Sitting, Cuff Size: Normal)   Pulse 74   Ht 5\' 8"  (1.727 m)   Wt 192 lb 12.8 oz (87.5 kg)   LMP 04/06/2022 (Exact Date)   SpO2 99%   BMI 29.32 kg/m  , BMI Body mass index is 29.32 kg/m.  GEN: Well nourished, well developed, in no acute distress. Neck: No JVD or carotid bruits. Cardiac:  RRR. No murmurs. No rubs or gallops.   Respiratory:  Respirations regular and unlabored. Clear to auscultation without rales, wheezing or rhonchi. GI: Soft, nontender, nondistended. Extremities: Radials/DP/PT 2+ and equal bilaterally. No clubbing or cyanosis. No edema.  Skin: Warm and dry, no rash. Neuro: Strength intact.  Assessment & Plan   Palpitations/PAT.  Exercise tolerance test December 2016 showed patient appeared to be in a flutter versus ectopic atrial tachycardia on initial ECG.  Patient reports occasional palpitations if she is under a lot stress or if she drinks too much caffeine. EKG shows NSR, rate 74 today.  Mitral valve prolapse.  Echo August 123XX123 showed midsystolic MVP regurgitation with moderate MR (unchanged from 2016).  Patient denies lower extremity  edema, shortness of breath, DOE, orthopnea or PND. 3/6 systolic murmur auscultated today.  Will get an echo to check on valve. Patient has concerns about the cost of the test and is encouraged to contact her insurance company to see how it will be covered.   Disposition: Echo. Return in 1 year or sooner as needed.         Signed, Justice Britain. Reymond Maynez, DNP, NP-C

## 2022-04-23 ENCOUNTER — Encounter: Payer: Self-pay | Admitting: Student

## 2022-04-23 ENCOUNTER — Ambulatory Visit: Payer: BC Managed Care – PPO | Attending: Internal Medicine | Admitting: Student

## 2022-04-23 VITALS — BP 124/74 | HR 74 | Ht 68.0 in | Wt 192.8 lb

## 2022-04-23 DIAGNOSIS — I341 Nonrheumatic mitral (valve) prolapse: Secondary | ICD-10-CM

## 2022-04-23 DIAGNOSIS — R002 Palpitations: Secondary | ICD-10-CM

## 2022-04-23 DIAGNOSIS — Z8679 Personal history of other diseases of the circulatory system: Secondary | ICD-10-CM

## 2022-04-23 NOTE — Patient Instructions (Signed)
Medication Instructions:  Your physician recommends that you continue on your current medications as directed. Please refer to the Current Medication list given to you today.    Lab Work: None ordered   Testing/Procedures: Your physician has requested that you have an echocardiogram. Echocardiography is a painless test that uses sound waves to create images of your heart. It provides your doctor with information about the size and shape of your heart and how well your heart's chambers and valves are working. This procedure takes approximately one hour. There are no restrictions for this procedure. Please do NOT wear cologne, perfume, aftershave, or lotions (deodorant is allowed). Please arrive 15 minutes prior to your appointment time.    Follow-Up: At [facility], you and your health needs are our priority.  As part of our continuing mission to provide you with exceptional heart care, we have created designated Provider Care Teams.  These Care Teams include your primary Cardiologist (physician) and Advanced Practice Providers (APPs -  Physician Assistants and Nurse Practitioners) who all work together to provide you with the care you need, when you need it.  We recommend signing up for the patient portal called "MyChart".  Sign up information is provided on this After Visit Summary.  MyChart is used to connect with patients for Virtual Visits (Telemedicine).  Patients are able to view lab/test results, encounter notes, upcoming appointments, etc.  Non-urgent messages can be sent to your provider as well.   To learn more about what you can do with MyChart, go to [email or web address].    Your next appointment:   1 year(s)  Provider:   Pixie Casino, MD

## 2022-05-03 ENCOUNTER — Ambulatory Visit: Payer: BC Managed Care – PPO | Admitting: Internal Medicine

## 2022-05-17 ENCOUNTER — Ambulatory Visit (HOSPITAL_COMMUNITY): Payer: BC Managed Care – PPO | Attending: Internal Medicine

## 2022-05-17 DIAGNOSIS — I341 Nonrheumatic mitral (valve) prolapse: Secondary | ICD-10-CM

## 2022-05-18 LAB — ECHOCARDIOGRAM COMPLETE
Area-P 1/2: 3.75 cm2
MV M vel: 4.59 m/s
MV Peak grad: 84.1 mmHg
S' Lateral: 2.6 cm

## 2022-05-24 ENCOUNTER — Telehealth: Payer: Self-pay | Admitting: Internal Medicine

## 2022-05-24 NOTE — Telephone Encounter (Signed)
Left voicemail to return call to office.

## 2022-05-24 NOTE — Telephone Encounter (Signed)
Patient returned call for her echo results.  

## 2022-05-28 ENCOUNTER — Telehealth: Payer: Self-pay | Admitting: Internal Medicine

## 2022-05-28 NOTE — H&P (View-Only) (Signed)
 Cardiology Clinic Note   Date: 06/01/2022 ID: Alicia Kelley, DOB 10/03/1990, MRN [id number]  Primary Cardiologist:  [name] Hilty, MD  Patient Profile    Alicia Kelley is a 32 y.o. female who presents to the clinic today for follow-up of echo.   Past medical history significant for: PAT. Exercise tolerance test 01/13/2015: Good exercise tolerance, no chest pain, normal BP response, no ST changes, adequate ETT.  Patient appeared to be in a flutter versus ectopic atrial tachycardia on initial ECG; rhythm disturbance resolved with exercise and patient in sinus at end of study. MVP. Echo 05/17/2022: EF 60 to 65%.  Severe LAE.  Severe MR.  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  Recommendation for TEE for further evaluation. Migraines.    History of Present Illness    Alicia Kelley was first evaluated by Dr. Hilty on 12/22/2014 for chest pain, shortness of breath, DOE, and palpitations. Patient described a history of heart murmur detected as a child and underwent echo which describe what sounded like MVP. She was seen by cardiologist in [place] whose name she cannot recall and underwent echo (records unavailable). Repeat echo was ordered and showed normal LV function with thickening and bileaflet prolapse of the mitral valve with moderate regurgitation similar to echo performed at [facility] pediatric cardiology in 2014. During exercise tolerance test patient appeared to be in a flutter versus ectopic atrial tachycardia on initial ECG (details above). Ms. Wilkie reestablish with Dr. Hilty in July 2021 and was overall doing well except for increased palpitations for several week that she attributed to OT that had an increased amount of caffeine. She was prescribed as needed metoprolol for persistent palpitations/tachycardia.  She was last seen in the office by me on 04/23/2022 to check on her mitral valve since her last echo in 2021. She had no complaints. She reported occasional  palpitations with stress or increased caffeine.  Echo showed normal LV function with severe LAE, severe MR, moderate prolapse of both leaflets of the mitral valve.  Recommendation for TEE for further evaluation.  Today, patient is here alone. She continues to be asymptomatic - no shortness of breath, DOE, lower extremity edema, orthopnea, PND, palpitations, lightheadedness or presyncope/syncope. She remains active biking at least twice a week and caring for her 2 year old child. Reviewed echo results in detail and answered all questions.     ROS: All other systems reviewed and are otherwise negative except as noted in History of Present Illness.  Studies Reviewed    ECG is not ordered today.          Physical Exam    VS:  BP 118/72 (BP Location: Right Arm, Patient Position: Sitting, Cuff Size: Normal)   Pulse 79   Ht 5' 8" (1.727 m)   Wt 191 lb 12.8 oz (87 kg)   SpO2 99%   BMI 29.16 kg/m  , BMI Body mass index is 29.16 kg/m.  GEN: Well nourished, well developed, in no acute distress. Neck: No JVD or carotid bruits. Cardiac:  RRR. 3/6 systolic murmur. No rubs or gallops.   Respiratory:  Respirations regular and unlabored. Clear to auscultation without rales, wheezing or rhonchi. GI: Soft, nontender, nondistended. Extremities: Radials/DP/PT 2+ and equal bilaterally. No clubbing or cyanosis. No edema.  Skin: Warm and dry, no rash. Neuro: Strength intact.  Assessment & Plan    Mitral valve prolapse.  Echo April 2024 showed normal LV function with severe LAE, severe MR and moderate bileaflet prolapse   of the mitral valve.  Patient denies increased palpitations, lower extremity edema, shortness of breath, DOE, orthopnea, or PND. All questions answered. 3/6 systolic murmur auscultated today.  Will schedule TEE for further evaluation.  Disposition: TEE for MVP. BMP and CBC prior to test. Return to Dr. Hilty in 3 months or sooner as needed.      Shared Decision Making/Informed  Consent The risks [esophageal damage, perforation (1:10,000 risk), bleeding, pharyngeal hematoma as well as other potential complications associated with conscious sedation including aspiration, arrhythmia, respiratory failure and death], benefits (treatment guidance and diagnostic support) and alternatives of a transesophageal echocardiogram were discussed in detail with Ms. Badders and she is willing to proceed.    Signed, Kriti Katayama M. Loriene Taunton, DNP, NP-C  

## 2022-05-28 NOTE — Telephone Encounter (Signed)
Pt says that she saw the results/recommendations and she would be ok with going ahead and scheduling the TEE without a f/u appt to discuss finding; and have a follow up appt after the TEE. She is ready to get it scheduled.  Told pt that I would send this information to her provider and we will be in touch.

## 2022-05-28 NOTE — Telephone Encounter (Signed)
  Pt would like to schedule an appt for TEE

## 2022-05-28 NOTE — Progress Notes (Signed)
Cardiology Clinic Note   Date: 06/01/2022 ID: Alicia Kelley, DOB 07/20/1990, MRN [id number]  Primary Cardiologist:  [name], MD  Patient Profile    Alicia Kelley is a 32 y.o. female who presents to the clinic today for follow-up of echo.   Past medical history significant for: PAT. Exercise tolerance test 01/13/2015: Good exercise tolerance, no chest pain, normal BP response, no ST changes, adequate ETT.  Patient appeared to be in a flutter versus ectopic atrial tachycardia on initial ECG; rhythm disturbance resolved with exercise and patient in sinus at end of study. MVP. Echo 05/17/2022: EF 60 to 65%.  Severe LAE.  Severe MR.  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  Recommendation for TEE for further evaluation. Migraines.    History of Present Illness    Alicia Kelley was first evaluated by Dr. Rennis Golden on 12/22/2014 for chest pain, shortness of breath, DOE, and palpitations. Patient described a history of heart murmur detected as a child and underwent echo which describe what sounded like MVP. She was seen by cardiologist in [place] whose name she cannot recall and underwent echo (records unavailable). Repeat echo was ordered and showed normal LV function with thickening and bileaflet prolapse of the mitral valve with moderate regurgitation similar to echo performed at [facility] pediatric cardiology in 2014. During exercise tolerance test patient appeared to be in a flutter versus ectopic atrial tachycardia on initial ECG (details above). Alicia Kelley reestablish with Dr. Rennis Golden in July 2021 and was overall doing well except for increased palpitations for several week that she attributed to OT that had an increased amount of caffeine. She was prescribed as needed metoprolol for persistent palpitations/tachycardia.  She was last seen in the office by me on 04/23/2022 to check on her mitral valve since her last echo in 2021. She had no complaints. She reported occasional  palpitations with stress or increased caffeine.  Echo showed normal LV function with severe LAE, severe MR, moderate prolapse of both leaflets of the mitral valve.  Recommendation for TEE for further evaluation.  Today, patient is here alone. She continues to be asymptomatic - no shortness of breath, DOE, lower extremity edema, orthopnea, PND, palpitations, lightheadedness or presyncope/syncope. She remains active biking at least twice a week and caring for her 34 year old child. Reviewed echo results in detail and answered all questions.     ROS: All other systems reviewed and are otherwise negative except as noted in History of Present Illness.  Studies Reviewed    ECG is not ordered today.          Physical Exam    VS:  BP 118/72 (BP Location: Right Arm, Patient Position: Sitting, Cuff Size: Normal)   Pulse 79   Ht 5\' 8"  (1.727 m)   Wt 191 lb 12.8 oz (87 kg)   SpO2 99%   BMI 29.16 kg/m  , BMI Body mass index is 29.16 kg/m.  GEN: Well nourished, well developed, in no acute distress. Neck: No JVD or carotid bruits. Cardiac:  RRR. 3/6 systolic murmur. No rubs or gallops.   Respiratory:  Respirations regular and unlabored. Clear to auscultation without rales, wheezing or rhonchi. GI: Soft, nontender, nondistended. Extremities: Radials/DP/PT 2+ and equal bilaterally. No clubbing or cyanosis. No edema.  Skin: Warm and dry, no rash. Neuro: Strength intact.  Assessment & Plan    Mitral valve prolapse.  Echo April 2024 showed normal LV function with severe LAE, severe MR and moderate bileaflet prolapse  of the mitral valve.  Patient denies increased palpitations, lower extremity edema, shortness of breath, DOE, orthopnea, or PND. All questions answered. 3/6 systolic murmur auscultated today.  Will schedule TEE for further evaluation.  Disposition: TEE for MVP. BMP and CBC prior to test. Return to Dr. Rennis Golden in 3 months or sooner as needed.      Shared Decision Making/Informed  Consent The risks [esophageal damage, perforation (1:10,000 risk), bleeding, pharyngeal hematoma as well as other potential complications associated with conscious sedation including aspiration, arrhythmia, respiratory failure and death], benefits (treatment guidance and diagnostic support) and alternatives of a transesophageal echocardiogram were discussed in detail with Alicia Kelley and she is willing to proceed.    Signed, Etta Grandchild. Traeh Milroy, DNP, NP-C

## 2022-05-28 NOTE — Telephone Encounter (Signed)
Hilty, Lisette Abu, MD  Scheryl Marten, RN Caller: Unspecified (Today,  3:20 PM) She needs to discuss the procedure with [place] before we can proceed, including risks and benefits since it is invasive and provide informed consent before we can schedule her.  That can be done with me or APP - virtual or in person.  Thanks.  Dr. Jory Sims pt and gave the information above. She will keep her appt for 5/10. She verbalized understanding.

## 2022-05-28 NOTE — Telephone Encounter (Signed)
Patient called. Aware she needs some type of appointment, as MD note. She will keep her 06/01/22 visit

## 2022-06-01 ENCOUNTER — Encounter: Payer: Self-pay | Admitting: Student

## 2022-06-01 ENCOUNTER — Ambulatory Visit: Payer: BC Managed Care – PPO | Attending: Student | Admitting: Student

## 2022-06-01 VITALS — BP 118/72 | HR 79 | Ht 68.0 in | Wt 191.8 lb

## 2022-06-01 DIAGNOSIS — I341 Nonrheumatic mitral (valve) prolapse: Secondary | ICD-10-CM | POA: Diagnosis not present

## 2022-06-01 DIAGNOSIS — Z01812 Encounter for preprocedural laboratory examination: Secondary | ICD-10-CM | POA: Diagnosis not present

## 2022-06-01 NOTE — Patient Instructions (Signed)
Medication Instructions:  Your physician recommends that you continue on your current medications as directed. Please refer to the Current Medication list given to you today.  *If you need a refill on your cardiac medications before your next appointment, please call your pharmacy*   Lab Work: Your physician recommends that you return Friday, May 17th to have the following labs drawn: BMET and CBC  If you have labs (blood work) drawn today and your tests are completely normal, you will receive your results only by: MyChart Message (if you have MyChart) OR A paper copy in the mail If you have any lab test that is abnormal or we need to change your treatment, we will call you to review the results.   Testing/Procedures: Your physician has requested that you have a TEE. During a TEE, sound waves are used to create images of your heart. It provides your doctor with information about the size and shape of your heart and how well your heart's chambers and valves are working. In this test, a transducer is attached to the end of a flexible tube that's guided down your throat and into your esophagus (the tube leading from you mouth to your stomach) to get a more detailed image of your heart. You are not awake for the procedure. Please see the instruction sheet given to you today. For further information please visit [email or web address]     Follow-Up: At [facility], you and your health needs are our priority.  As part of our continuing mission to provide you with exceptional heart care, we have created designated Provider Care Teams.  These Care Teams include your primary Cardiologist (physician) and Advanced Practice Providers (APPs -  Physician Assistants and Nurse Practitioners) who all work together to provide you with the care you need, when you need it.  We recommend signing up for the patient portal called "MyChart".  Sign up information is provided on this After Visit Summary.  MyChart  is used to connect with patients for Virtual Visits (Telemedicine).  Patients are able to view lab/test results, encounter notes, upcoming appointments, etc.  Non-urgent messages can be sent to your provider as well.   To learn more about what you can do with MyChart, go to [email or web address].    Your next appointment:   3 month(s)  Provider:   [name], MD     Other Instructions   Dear Alicia Kelley  You are scheduled for a TEE (Transesophageal Echocardiogram) on Friday, May 24 with Dr. Cristal Deer.  Please arrive at the [facility] (Main Entrance A) at [facility]: [address] [place], [place] [address] at 10:00 AM (This time is 1 hour(s) before your procedure to ensure your preparation). Free valet parking service is available. You will check in at ADMITTING. The support person will be asked to wait in the waiting room.  It is OK to have someone drop you off and come back when you are ready to be discharged.     DIET:  Nothing to eat or drink after midnight except a sip of water with medications (see medication instructions below)  MEDICATION INSTRUCTIONS: NONE  LABS:  Come to [facility]  at [facility] on Friday May 17th between the hours of 8am and 4:30pm to have your labs drawn.   FYI:  For your safety, and to allow [place] to monitor your vital signs accurately during the surgery/procedure we request: If you have artificial nails, gel coating, SNS etc, please have those  removed prior to your surgery/procedure. Not having the nail coverings /polish removed may result in cancellation or delay of your surgery/procedure.  You must have a responsible person to drive you home and stay in the waiting area during your procedure. Failure to do so could result in cancellation.  Bring your insurance cards.  *Special Note: Every effort is made to have your procedure done on time. Occasionally there are emergencies that occur at the hospital that may cause delays.  Please be patient if a delay does occur.

## 2022-06-08 ENCOUNTER — Other Ambulatory Visit: Payer: Self-pay

## 2022-06-08 DIAGNOSIS — Z01812 Encounter for preprocedural laboratory examination: Secondary | ICD-10-CM

## 2022-06-08 DIAGNOSIS — I341 Nonrheumatic mitral (valve) prolapse: Secondary | ICD-10-CM

## 2022-06-09 LAB — BASIC METABOLIC PANEL
BUN/Creatinine Ratio: 16 (ref 9–23)
BUN: 15 mg/dL (ref 6–20)
CO2: 22 mmol/L (ref 20–29)
Calcium: 9.4 mg/dL (ref 8.7–10.2)
Chloride: 100 mmol/L (ref 96–106)
Creatinine, Ser: 0.91 mg/dL (ref 0.57–1.00)
Glucose: 77 mg/dL (ref 70–99)
Potassium: 4.7 mmol/L (ref 3.5–5.2)
Sodium: 137 mmol/L (ref 134–144)
eGFR: 88 mL/min/{1.73_m2} (ref >59)

## 2022-06-09 LAB — CBC
Hematocrit: 38.3 % (ref 34.0–46.6)
Hemoglobin: 13.6 g/dL (ref 11.1–15.9)
MCH: 31 pg (ref 26.6–33.0)
MCHC: 35.5 g/dL (ref 31.5–35.7)
MCV: 87 fL (ref 79–97)
Platelets: 354 10*3/uL (ref 150–450)
RBC: 4.39 x10E6/uL (ref 3.77–5.28)
RDW: 12.8 % (ref 11.7–15.4)
WBC: 5.9 10*3/uL (ref 3.4–10.8)

## 2022-06-14 NOTE — Progress Notes (Signed)
Message left, Procedure scheduled for 1100, Please arrive at the hospital at 1000, NPO after midnight on Thursday, May take meds with sips of water in the AM, please have transportation for home post procedure, and someone to stay with pt for approximately 24 hours after  Please plan on pregnancy test in AM (plan to void)  Please call [phone]

## 2022-06-15 ENCOUNTER — Ambulatory Visit (HOSPITAL_COMMUNITY): Payer: BC Managed Care – PPO | Admitting: Anesthesiology

## 2022-06-15 ENCOUNTER — Other Ambulatory Visit: Payer: Self-pay

## 2022-06-15 ENCOUNTER — Telehealth: Payer: Self-pay

## 2022-06-15 ENCOUNTER — Ambulatory Visit (HOSPITAL_COMMUNITY)
Admission: RE | Admit: 2022-06-15 | Discharge: 2022-06-15 | Disposition: A | Discharge status: Home / Self Care | Payer: BC Managed Care – PPO | Attending: Cardiology | Admitting: Cardiology

## 2022-06-15 ENCOUNTER — Ambulatory Visit (HOSPITAL_BASED_OUTPATIENT_CLINIC_OR_DEPARTMENT_OTHER)
Admission: RE | Admit: 2022-06-15 | Discharge: 2022-06-15 | Disposition: A | Discharge status: Home / Self Care | Payer: BC Managed Care – PPO | Source: Ambulatory Visit | Attending: Cardiology | Admitting: Cardiology

## 2022-06-15 ENCOUNTER — Encounter (HOSPITAL_COMMUNITY)
Admission: RE | Disposition: A | Discharge status: Home / Self Care | Payer: Self-pay | Source: Home / Self Care | Attending: Cardiology

## 2022-06-15 ENCOUNTER — Encounter (HOSPITAL_COMMUNITY): Payer: Self-pay | Admitting: Cardiology

## 2022-06-15 DIAGNOSIS — I340 Nonrheumatic mitral (valve) insufficiency: Secondary | ICD-10-CM | POA: Diagnosis not present

## 2022-06-15 DIAGNOSIS — I341 Nonrheumatic mitral (valve) prolapse: Secondary | ICD-10-CM | POA: Diagnosis not present

## 2022-06-15 HISTORY — PX: TEE WITHOUT CARDIOVERSION: SHX5443

## 2022-06-15 LAB — PREGNANCY, URINE: Preg Test, Ur: NEGATIVE

## 2022-06-15 SURGERY — ECHOCARDIOGRAM, TRANSESOPHAGEAL
Anesthesia: Monitor Anesthesia Care

## 2022-06-15 MED ORDER — PROPOFOL 500 MG/50ML IV EMUL
INTRAVENOUS | Status: DC | PRN
  Administered 2022-06-15: 150 ug/kg/min via INTRAVENOUS

## 2022-06-15 MED ORDER — PROPOFOL 10 MG/ML IV BOLUS
INTRAVENOUS | Status: DC | PRN
  Administered 2022-06-15: 20 mg via INTRAVENOUS

## 2022-06-15 MED ORDER — SODIUM CHLORIDE 0.9 % IV SOLN: INTRAVENOUS | Status: DC

## 2022-06-15 MED ORDER — MIDAZOLAM HCL 2 MG/2ML IJ SOLN
INTRAMUSCULAR | Status: DC | PRN
  Administered 2022-06-15: 2 mg via INTRAVENOUS

## 2022-06-15 NOTE — Progress Notes (Signed)
  Echocardiogram Echocardiogram Transesophageal has been performed.  Alicia Kelley 06/15/2022, 12:25 PM

## 2022-06-15 NOTE — Anesthesia Postprocedure Evaluation (Signed)
Anesthesia Post Note  Patient: Alicia Kelley  Procedure(s) Performed: TRANSESOPHAGEAL ECHOCARDIOGRAM     Patient location during evaluation: PACU Anesthesia Type: MAC Level of consciousness: awake Pain management: pain level controlled Vital Signs Assessment: post-procedure vital signs reviewed and stable Respiratory status: spontaneous breathing, nonlabored ventilation and respiratory function stable Cardiovascular status: stable and blood pressure returned to baseline Postop Assessment: no apparent nausea or vomiting Anesthetic complications: no   No notable events documented.  Last Vitals:  Vitals:   06/15/22 1230 06/15/22 1245  BP: 115/60   Pulse: 77 90  Resp: 16 (!) 23  Temp:    SpO2: 99% 100%    Last Pain:  Vitals:   06/15/22 1219  TempSrc: Tympanic  PainSc:                  Linton Rump

## 2022-06-15 NOTE — Interval H&P Note (Signed)
History and Physical Interval Note:  06/15/2022 11:31 AM  Alicia Kelley  has presented today for surgery, with the diagnosis of MITRAL VALVE PROLAPSE.  The various methods of treatment have been discussed with the patient and family. After consideration of risks, benefits and other options for treatment, the patient has consented to  Procedure(s): TRANSESOPHAGEAL ECHOCARDIOGRAM (N/A) as a surgical intervention.  The patient's history has been reviewed, patient examined, no change in status, stable for surgery.  I have reviewed the patient's chart and labs.  Questions were answered to the patient's satisfaction.     [name] Cristal Deer

## 2022-06-15 NOTE — Anesthesia Preprocedure Evaluation (Addendum)
Anesthesia Evaluation  Patient identified by MRN, date of birth, ID band Patient awake    Reviewed: Allergy & Precautions, NPO status , Patient's Chart, lab work & pertinent test results  History of Anesthesia Complications Negative for: history of anesthetic complications  Airway Mallampati: III  TM Distance: >3 FB Neck ROM: Full    Dental  (+) Dental Advisory Given   Pulmonary neg pulmonary ROS   Pulmonary exam normal breath sounds clear to auscultation       Cardiovascular Exercise Tolerance: Good (-) hypertension(-) angina (-) Past MI, (-) Cardiac Stents, (-) CABG and (-) DOE + dysrhythmias (PAT) + Valvular Problems/Murmurs MVP and MR  Rhythm:Regular Rate:Normal + Systolic murmurs TTE 05/17/2022: IMPRESSIONS     1. Left ventricular ejection fraction, by estimation, is 60 to 65%. The  left ventricle has normal function. The left ventricle has no regional  wall motion abnormalities. Left ventricular diastolic parameters were  normal.   2. Right ventricular systolic function is normal. The right ventricular  size is normal. Tricuspid regurgitation signal is inadequate for assessing  PA pressure.   3. Left atrial size was severely dilated.   4. The mitral valve is abnormal. Severe mitral valve regurgitation. No  evidence of mitral stenosis. There is moderate prolapse of both leaflets  of the mitral valve.   5. The aortic valve is normal in structure. Aortic valve regurgitation is  not visualized. No aortic stenosis is present.   6. The inferior vena cava is normal in size with greater than 50%  respiratory variability, suggesting right atrial pressure of 3 mmHg.   7. 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 Consider TEE for  further evaluation.     Neuro/Psych  Headaches    GI/Hepatic negative GI ROS, Neg liver ROS,,,  Endo/Other  negative endocrine ROS    Renal/GU negative Renal ROS     Musculoskeletal    Abdominal   Peds  Hematology negative hematology ROS (+)   Anesthesia Other Findings   Reproductive/Obstetrics                             Anesthesia Physical Anesthesia Plan  ASA: 3  Anesthesia Plan: MAC   Post-op Pain Management:    Induction: Intravenous  PONV Risk Score and Plan: 2 and Propofol infusion and Treatment may vary due to age or medical condition  Airway Management Planned: Natural Airway and Nasal Cannula  Additional Equipment:   Intra-op Plan:   Post-operative Plan:   Informed Consent: I have reviewed the patients History and Physical, chart, labs and discussed the procedure including the risks, benefits and alternatives for the proposed anesthesia with the patient or authorized representative who has indicated his/her understanding and acceptance.     Dental advisory given  Plan Discussed with: CRNA and Anesthesiologist  Anesthesia Plan Comments: (Discussed with patient risks of MAC including, but not limited to, minor pain or discomfort, hearing people in the room, and possible need for backup general anesthesia. Risks for general anesthesia also discussed including, but not limited to, sore throat, hoarse voice, chipped/damaged teeth, injury to vocal cords, nausea and vomiting, allergic reactions, lung infection, heart attack, stroke, and death. All questions answered. )        Anesthesia Quick Evaluation

## 2022-06-15 NOTE — Telephone Encounter (Signed)
Called and spoke with patient- advised of the below. Referral placed.   Advised patient to call back to office with any issues, questions, or concerns. Patient verbalized understanding.     Author: [name], MD Service: Cardiology Author Type: Physician  Filed: 06/15/2022 12:43 PM Date of Service: 06/15/2022 12:42 PM Status: Signed  Editor: [name], MD (Physician)   TEE today showed severe MR - Please refer patient (and let her know about the referral) to Dr. Eugenio Hoes with cardiac surgery to evaluate for possible mitral valve repair.   Thanks.   Dr. Rennis Golden

## 2022-06-15 NOTE — CV Procedure (Signed)
    TRANSESOPHAGEAL ECHOCARDIOGRAM   NAME:  Alicia Kelley   MRN: [id number] DOB:  12-26-1990   ADMIT DATE: 06/15/2022  INDICATIONS: Mitral valve prolapse  PROCEDURE:   Informed consent was obtained prior to the procedure. The risks, benefits and alternatives for the procedure were discussed and the patient comprehended these risks.  Risks include, but are not limited to, cough, sore throat, vomiting, nausea, somnolence, esophageal and stomach trauma or perforation, bleeding, low blood pressure, aspiration, pneumonia, infection, trauma to the teeth and death.    Procedural time out performed.  Patient received monitored anesthesia care under the supervision of Dr. Isaias Cowman. Patient received a total of 434 mg propofol and 2 mg midazolam during the procedure.  The transesophageal probe was inserted in the esophagus and stomach without difficulty and multiple views were obtained.    COMPLICATIONS:    There were no immediate complications. Very light pink tinged secretions at the end of case. Saturating normally, no frank bleeding seen.  FINDINGS:  LEFT VENTRICLE: EF = 60-65%. No regional wall motion abnormalities.  RIGHT VENTRICLE: Normal size and function.   LEFT ATRIUM: No thrombus/mass.  LEFT ATRIAL APPENDAGE: No thrombus/mass.   RIGHT ATRIUM: No thrombus/mass.  AORTIC VALVE:  Trileaflet. No regurgitation. No vegetation.  MITRAL VALVE:    Abnormal structure. Moderate bileaflet prolapse with small posterior leaflet flail. There is eccentric MR with splay. There is flow reversal in the pulmonary vein, consistent with severe regurgitation. No vegetation.  TRICUSPID VALVE: Mildly myxomatous. Trivial regurgitation. No vegetation.  PULMONIC VALVE: Grossly normal structure. Trivial regurgitation. No apparent vegetation.  INTERATRIAL SEPTUM: No PFO or ASD seen by color Doppler.  PERICARDIUM: No effusion noted.  AORTA: Normal size ascending aorta. No plaque seen in descending  aorta   CONCLUSION: Bileaflet mitral valve prolapse, with small flail of posterior leaflet. Eccentric, severe MR.   Jodelle Red, MD, PhD, [facility]  [facility]  [place]  Heart & Vascular at [facility] at [facility] [address] [place], [place] [address] [phone]   12:12 PM

## 2022-06-15 NOTE — Progress Notes (Signed)
TEE today showed severe MR - Please refer patient (and let her know about the referral) to Dr. Eugenio Hoes with cardiac surgery to evaluate for possible mitral valve repair.  Thanks.  Dr. Rennis Golden

## 2022-06-15 NOTE — Transfer of Care (Signed)
Immediate Anesthesia Transfer of Care Note  Patient: Alicia Kelley  Procedure(s) Performed: TRANSESOPHAGEAL ECHOCARDIOGRAM  Patient Location: Cath Lab  Anesthesia Type:MAC  Level of Consciousness: sedated  Airway & Oxygen Therapy: Patient Spontanous Breathing and Patient connected to nasal cannula oxygen  Post-op Assessment: Report given to RN and Post -op Vital signs reviewed and stable  Post vital signs: Reviewed and stable  Last Vitals:  Vitals Value Taken Time  BP 111/54 06/15/22 1217  Temp 36.4 C 06/15/22 1219  Pulse 82 06/15/22 1223  Resp 16 06/15/22 1223  SpO2 96 % 06/15/22 1223  Vitals shown include unvalidated device data.  Last Pain:  Vitals:   06/15/22 1219  TempSrc: Tympanic  PainSc:          Complications: No notable events documented.

## 2022-06-15 NOTE — Telephone Encounter (Signed)
-----   Message from [name], MD sent at 06/15/2022 12:42 PM EDT -----    ----- Message ----- From: Jodelle Red, MD Sent: 06/15/2022  12:17 PM EDT To: [name], MD; Carlos Levering, NP

## 2022-06-19 ENCOUNTER — Encounter (HOSPITAL_COMMUNITY): Payer: Self-pay | Admitting: Cardiology

## 2022-06-19 NOTE — Telephone Encounter (Signed)
Appointment 07/09/22 with Dr.Weldner

## 2022-07-08 NOTE — Progress Notes (Unsigned)
[address]       [place] [address]             [phone]           Tannaz Eckard [facility] Medical Record #[id number] Date of Birth: August 20, 1990  Alicia Nose, MD Olive Bass, FNP  Chief Complaint:  Severe MR   History of Present Illness:     Pt is a very pleasant 32 yo female who was told had mitral valve prolapse in the past. Pt was to have follow up especially around the time of her pregnancy three years ago however that didn't happen. She then thought that maybe she should have it looked at again and saw cardiology recently. Pt feels that overall she is doing well. She is active with a toddler and without much symptoms of SOB or fatigue but now that she knows she has severe MR she may be hypervigilent to some DOE. She has a history of Palpitations in high school that led to the diagnosis of prolapse but is not feeling palpitations much at all now. Her recent ehco has normal LV function and severe MR from what appears to be bileaflet prolapse, some possible flail of P2 and perhaps early Barlows pathology. No evidence of PHTN or LV dilation but does have LA dilation.      Past Medical History:  Diagnosis Date   H/O mitral valve prolapse    moderate regurgitation, mild left atrial enlargement   Mitral valve prolapse     Past Surgical History:  Procedure Laterality Date   NO PAST SURGERIES     TEE WITHOUT CARDIOVERSION N/A 06/15/2022   Procedure: TRANSESOPHAGEAL ECHOCARDIOGRAM;  Surgeon: Jodelle Red, MD;  Location: [facility] INVASIVE CV LAB;  Service: Cardiovascular;  Laterality: N/A;    Social History   Tobacco Use  Smoking Status Never  Smokeless Tobacco Never    Social History   Substance and Sexual Activity  Alcohol Use Never    Social History   Socioeconomic History   Marital status: Unknown    Spouse name: Not on file   Number of children: Not on file   Years of education: Not on file   Highest education level:  Not on file  Occupational History   Not on file  Tobacco Use   Smoking status: Never   Smokeless tobacco: Never  Vaping Use   Vaping Use: Never used  Substance and Sexual Activity   Alcohol use: Never   Drug use: Never   Sexual activity: Yes  Other Topics Concern   Not on file  Social History Narrative   Not on file   Social Determinants of Health   Financial Resource Strain: Not on file  Food Insecurity: Not on file  Transportation Needs: Not on file  Physical Activity: Not on file  Stress: Not on file  Social Connections: Not on file  Intimate Partner Violence: Not on file    Allergies  Allergen Reactions   Magnesium Palpitations   Lidocaine Swelling    Pt. Only reacts to the topical lidocaine     Current Outpatient Medications  Medication Sig Dispense Refill   acetaminophen (TYLENOL) 500 MG tablet Take 500 mg by mouth every 6 (six) hours as needed.     cetirizine (ZYRTEC) 10 MG tablet Take 10 mg by mouth daily as needed for allergies.     Hydrocortisone (PREPARATION H EX) Apply 1 Application topically daily as needed (hemorrhoids).  ibuprofen (ADVIL) 200 MG tablet Take 400 mg by mouth every 6 (six) hours as needed for moderate pain.     norethindrone (MICRONOR) 0.35 MG tablet Take 1 tablet by mouth daily.     triamcinolone cream (KENALOG) 0.1 % Apply 1 Application topically 2 (two) times daily. (Patient taking differently: Apply 1 Application topically 2 (two) times daily as needed (irritation).) 453.6 g 0   No current facility-administered medications for this visit.     Family History  Problem Relation Age of Onset   Hypertension Mother    Healthy Father    Heart murmur Sister    Healthy Brother        Physical Exam: Lungs: clear Card: RR with 2/6 sem Ext: warm no edema Neuro: alert and no focal issues Teeth in good repair     Diagnostic Studies & Laboratory data: I have personally reviewed the following studies and agree with the  findings   TTE (04/2022) IMPRESSIONS     1. Left ventricular ejection fraction, by estimation, is 60 to 65%. The  left ventricle has normal function. The left ventricle has no regional  wall motion abnormalities. Left ventricular diastolic parameters were  normal.   2. Right ventricular systolic function is normal. The right ventricular  size is normal. Tricuspid regurgitation signal is inadequate for assessing  PA pressure.   3. Left atrial size was severely dilated.   4. The mitral valve is abnormal. Severe mitral valve regurgitation. No  evidence of mitral stenosis. There is moderate prolapse of both leaflets  of the mitral valve.   5. The aortic valve is normal in structure. Aortic valve regurgitation is  not visualized. No aortic stenosis is present.   6. The inferior vena cava is normal in size with greater than 50%  respiratory variability, suggesting right atrial pressure of 3 mmHg.   7. 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 Consider TEE for  further evaluation.   FINDINGS   Left Ventricle: Left ventricular ejection fraction, by estimation, is 60  to 65%. The left ventricle has normal function. The left ventricle has no  regional wall motion abnormalities. The left ventricular internal cavity  size was normal in size. There is   no left ventricular hypertrophy. Left ventricular diastolic parameters  were normal.   Right Ventricle: The right ventricular size is normal. No increase in  right ventricular wall thickness. Right ventricular systolic function is  normal. Tricuspid regurgitation signal is inadequate for assessing PA  pressure.   Left Atrium: Left atrial size was severely dilated.   Right Atrium: Right atrial size was normal in size.   Pericardium: There is no evidence of pericardial effusion.   Mitral Valve: The mitral valve is abnormal. There is moderate prolapse of  both leaflets of the mitral valve. Severe mitral valve regurgitation. No   evidence of mitral valve stenosis.   Tricuspid Valve: The tricuspid valve is normal in structure. Tricuspid  valve regurgitation is trivial. No evidence of tricuspid stenosis.   Aortic Valve: The aortic valve is normal in structure. Aortic valve  regurgitation is not visualized. No aortic stenosis is present.   Pulmonic Valve: The pulmonic valve was normal in structure. Pulmonic valve  regurgitation is not visualized. No evidence of pulmonic stenosis.   Aorta: The aortic root is normal in size and structure.   Venous: The inferior vena cava is normal in size with greater than 50%  respiratory variability, suggesting right atrial pressure of 3 mmHg.   IAS/Shunts:  No atrial level shunt detected by color flow Doppler.     LEFT VENTRICLE  PLAX 2D  LVIDd:         5.30 cm   Diastology  LVIDs:         2.60 cm   LV e' medial:    10.70 cm/s  LV PW:         0.90 cm   LV E/e' medial:  10.0  LV IVS:        0.60 cm   LV e' lateral:   14.80 cm/s  LVOT diam:     1.90 cm   LV E/e' lateral: 7.3  LV SV:         35  LV SV Index:   18        2D Longitudinal Strain  LVOT Area:     2.84 cm  2D Strain GLS (A2C):   -24.4 %                           2D Strain GLS (A3C):   -24.2 %                           2D Strain GLS (A4C):   -24.9 %                           2D Strain GLS Avg:     -24.5 %                             3D Volume EF:                           3D EF:        60 %                           LV EDV:       163 ml                           LV ESV:       65 ml                           LV SV:        98 ml   RIGHT VENTRICLE             IVC  RV Basal diam:  3.60 cm     IVC diam: 1.70 cm  RV S prime:     16.75 cm/s  TAPSE (M-mode): 2.8 cm   LEFT ATRIUM             Index        RIGHT ATRIUM           Index  LA diam:        4.20 cm 2.09 cm/m   RA Area:     13.40 cm  LA Vol (A2C):   60.2 ml 29.92 ml/m  RA Volume:   32.80 ml  16.30 ml/m  LA Vol (A4C):   73.2 ml 36.38 ml/m  LA Biplane  Vol: 66.8 ml 33.20 ml/m   AORTIC VALVE  LVOT Vmax:   70.73 cm/s  LVOT Vmean:  43.333 cm/s  LVOT VTI:    0.124 m    AORTA  Ao Root diam: 3.40 cm  Ao Asc diam:  3.00 cm   MITRAL VALVE  MV Area (PHT): 3.75 cm     SHUNTS  MV Decel Time: 203 msec     Systemic VTI:  0.12 m  MR Peak grad: 84.1 mmHg     Systemic Diam: 1.90 cm  MR Mean grad: 58.0 mmHg  MR Vmax:      458.50 cm/s  MR Vmean:     360.0 cm/s  MV E velocity: 107.50 cm/s  MV A velocity: 59.15 cm/s  MV E/A ratio:  1.82    Recent Radiology Findings:       Recent Lab Findings: Lab Results  Component Value Date   WBC 5.9 06/08/2022   HGB 13.6 06/08/2022   HCT 38.3 06/08/2022   PLT 354 06/08/2022   GLUCOSE 77 06/08/2022   ALT 12 12/01/2021   AST 17 12/01/2021   NA 137 06/08/2022   K 4.7 06/08/2022   CL 100 06/08/2022   CREATININE 0.91 06/08/2022   BUN 15 06/08/2022   CO2 22 06/08/2022   TSH 1.210 12/01/2021      Assessment / Plan:     32 yo female with NYHA class I symptoms 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 Pt with this level of pathology would be a more difficult repair (90% repairability at this point) and with her relatively being asymptomatic and anticipating at some point another child that ongoing medical surveillance is probably warranted. She and her husband understand the issues and of course want to have the surgery if it is needed but I feel observation over the next 6 months is best. She will see Cardiology in September or call earlier if she wishes to proceed with surgery.   I have spent 60 min in review of the records, viewing studies and in face to face with patient and in coordination of future care    Eugenio Hoes 07/08/2022 7:12 PM

## 2022-07-09 ENCOUNTER — Institutional Professional Consult (permissible substitution): Payer: BC Managed Care – PPO | Admitting: Thoracic Surgery (Cardiothoracic Vascular Surgery)

## 2022-07-09 ENCOUNTER — Encounter: Payer: BC Managed Care – PPO | Admitting: Thoracic Surgery (Cardiothoracic Vascular Surgery)

## 2022-07-09 ENCOUNTER — Encounter: Payer: Self-pay | Admitting: Thoracic Surgery (Cardiothoracic Vascular Surgery)

## 2022-07-09 VITALS — BP 123/80 | HR 65 | Resp 20 | Ht 68.0 in | Wt 184.0 lb

## 2022-07-09 DIAGNOSIS — I340 Nonrheumatic mitral (valve) insufficiency: Secondary | ICD-10-CM

## 2022-07-09 NOTE — Patient Instructions (Signed)
Continue medical surveillance

## 2022-07-27 LAB — ECHO TEE
MV M vel: 4.51 m/s
MV Peak grad: 81.4 mmHg
Radius: 1 cm

## 2022-08-27 ENCOUNTER — Other Ambulatory Visit: Payer: Self-pay | Admitting: Thoracic Surgery (Cardiothoracic Vascular Surgery)

## 2022-08-27 DIAGNOSIS — I340 Nonrheumatic mitral (valve) insufficiency: Secondary | ICD-10-CM

## 2022-09-05 ENCOUNTER — Ambulatory Visit
Admission: RE | Admit: 2022-09-05 | Discharge: 2022-09-05 | Disposition: A | Discharge status: Home / Self Care | Payer: BC Managed Care – PPO | Source: Ambulatory Visit | Attending: Thoracic Surgery (Cardiothoracic Vascular Surgery) | Admitting: Thoracic Surgery (Cardiothoracic Vascular Surgery)

## 2022-09-05 DIAGNOSIS — I341 Nonrheumatic mitral (valve) prolapse: Secondary | ICD-10-CM | POA: Diagnosis not present

## 2022-09-05 DIAGNOSIS — I340 Nonrheumatic mitral (valve) insufficiency: Secondary | ICD-10-CM

## 2022-10-03 ENCOUNTER — Encounter: Payer: Self-pay | Admitting: Internal Medicine

## 2022-10-03 ENCOUNTER — Ambulatory Visit: Payer: BC Managed Care – PPO | Attending: Internal Medicine | Admitting: Internal Medicine

## 2022-10-03 VITALS — BP 115/76 | HR 70 | Ht 68.0 in | Wt 177.6 lb

## 2022-10-03 DIAGNOSIS — I340 Nonrheumatic mitral (valve) insufficiency: Secondary | ICD-10-CM

## 2022-10-03 DIAGNOSIS — I341 Nonrheumatic mitral (valve) prolapse: Secondary | ICD-10-CM

## 2022-10-03 DIAGNOSIS — Z8679 Personal history of other diseases of the circulatory system: Secondary | ICD-10-CM

## 2022-10-03 DIAGNOSIS — R002 Palpitations: Secondary | ICD-10-CM | POA: Diagnosis not present

## 2022-10-03 NOTE — Progress Notes (Signed)
OFFICE NOTE  Chief Complaint:  Follow-up  Primary Care Physician: Olive Bass, FNP  HPI:  Alicia Kelley is a 32 year old female who recently graduated from [employer] a and [employer]. 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 Her past medical history is significant for heart murmur which was detected at a young age. She side do cardiologist in [place] who was a pediatric cardiologist. She apparently had an echo in the past and described a valvular abnormality which sounds like mitral valve prolapse. Subsequently she transitioned to a cardiologist in [place] but cannot remember the name of the cardiologist. She's had an echocardiogram before but no records of that are available. Recently she's been having a number of symptoms which are concerning. She is reporting chest pain, shortness of breath, particularly walking up stairs, headache which sounds migrainous in nature-there is photophobia and phonophobia with associated nausea and light sensitivity. In addition she is reporting palpitations. Those occur mostly at night. Her chest pain is sharp and not necessarily associated with exertion. It seems to last for only a few minutes. She does report some anxiety particularly about the symptoms. She recently had screening blood work and physical through her primary care provider. Cholesterol appears to be well-controlled. Her EKG shows sinus rhythm but there are some nonspecific ST and T-wave changes. Family history is significant for murmur in her brother and father.  I the pleasure see Alicia Kelley back today in follow-up. She underwent an exercise treadmill stress test which was low risk however she had interim development of PAT or possibly atrial tachycardia during the procedure. This likely explains the palpitation she's been feeling. A repeat echo was performed which shows normal LV function  however she does have thickening and bileaflet prolapse of the mitral valve and moderate regurgitation. This is similar to the findings of her echo which was performed by [facility] pediatric cardiology in 2014. She was previously seeing Dr. Mayer Camel. The findings now indicate that she does have some left atrial enlargement which is mild and was not previously present.  08/19/2019  Alicia Kelley is here to reestablish cardiac care.  She is considered a new patient since I have not seen her in more than 3 years.  It sounds like overall she has been doing well until recently when she has developed some palpitations.  Those were more frequent several weeks ago.  She had changed her diet around some and was drinking a tea which apparently contained twice as much caffeine is coffee and I suspect this may have provoked her palpitations.  We had discovered that she has moderate mitral valve prolapse with mild to moderate mitral regurgitation and some left atrial enlargement by echo in 2016.  She is not had a repeat echo since then.  Overall she denies any chest pain or worsening shortness of breath with exertion.  10/03/2022  Alicia Kelley is seen today in follow-up.  She recently changed her name although has been married since 2020.  She was referred to see Dr. Milinda Antis for evaluation of severe mitral regurgitation.  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  He gave her possibly 90% chance of successful repair.  We then have to think about what options it would be for replacement if not successful and this could likely include mechanical valve given her younger age.  This would necessitate warfarin anticoagulation and if  she would consider having another child it could be an issue for her.  He felt that at this point since she was asymptomatic he would recommend monitoring further.  She returns today and continues to have no significant symptoms except with marked exercise.  She does  feel some fullness or tightness in her chest when exercising and heart rate gets up into about the 150s.  She gets some occasional palpitations otherwise but nothing sustained.  PMHx:  Past Medical History:  Diagnosis Date   H/O mitral valve prolapse    moderate regurgitation, mild left atrial enlargement   Mitral valve prolapse     Past Surgical History:  Procedure Laterality Date   NO PAST SURGERIES     TEE WITHOUT CARDIOVERSION N/A 06/15/2022   Procedure: TRANSESOPHAGEAL ECHOCARDIOGRAM;  Surgeon: Jodelle Red, MD;  Location: [facility] INVASIVE CV LAB;  Service: Cardiovascular;  Laterality: N/A;    FAMHx:  Family History  Problem Relation Age of Onset   Hypertension Mother    Healthy Father    Heart murmur Sister    Healthy Brother     SOCHx:   reports that she has never smoked. She has never used smokeless tobacco. She reports that she does not drink alcohol and does not use drugs.  ALLERGIES:  Allergies  Allergen Reactions   Magnesium Palpitations   Lidocaine Swelling    Pt. Only reacts to the topical lidocaine     ROS: Pertinent items noted in HPI and remainder of comprehensive ROS otherwise negative.  HOME MEDS: Current Outpatient Medications  Medication Sig Dispense Refill   acetaminophen (TYLENOL) 500 MG tablet Take 500 mg by mouth every 6 (six) hours as needed.     cetirizine (ZYRTEC) 10 MG tablet Take 10 mg by mouth daily as needed for allergies.     Hydrocortisone (PREPARATION H EX) Apply 1 Application topically daily as needed (hemorrhoids).     ibuprofen (ADVIL) 200 MG tablet Take 400 mg by mouth every 6 (six) hours as needed for moderate pain.     norethindrone (MICRONOR) 0.35 MG tablet Take 1 tablet by mouth daily.     triamcinolone cream (KENALOG) 0.1 % Apply 1 Application topically 2 (two) times daily. (Patient taking differently: Apply 1 Application topically 2 (two) times daily as needed (irritation).) 453.6 g 0   No current facility-administered  medications for this visit.    LABS/IMAGING: No results found for this or any previous visit (from the past 48 hour(s)). No results found.  WEIGHTS: Wt Readings from Last 3 Encounters:  10/03/22 177 lb 9.6 oz (80.6 kg)  07/09/22 184 lb (83.5 kg)  06/15/22 189 lb (85.7 kg)    VITALS: BP 115/76 (BP Location: Right Arm, Patient Position: Sitting, Cuff Size: Normal)   Pulse 70   Ht 5\' 8"  (1.727 m)   Wt 177 lb 9.6 oz (80.6 kg)   SpO2 99%   BMI 27.00 kg/m   EXAM: General appearance: alert and no distress Neck: no carotid bruit, no JVD and thyroid not enlarged, symmetric, no tenderness/mass/nodules Lungs: clear to auscultation bilaterally Heart: regular rate and rhythm, S1, S2 normal and systolic murmur: early systolic 3/6, blowing at apex Abdomen: soft, non-tender; bowel sounds normal; no masses,  no organomegaly Extremities: extremities normal, atraumatic, no cyanosis or edema Pulses: 2+ and symmetric Skin: Skin color, texture, turgor normal. No rashes or lesions Neurologic: Grossly normal Psych: Pleasant  EKG: EKG Interpretation Date/Time:  Wednesday October 03 2022 13:00:53 EDT Ventricular Rate:  70 PR Interval:  170 QRS Duration:  90 QT Interval:  410 QTC Calculation: 442 R Axis:   42  Text Interpretation: Normal sinus rhythm with sinus arrhythmia Nonspecific T wave abnormality No significant change since last tracing Confirmed by Zoila Shutter [phone]) on 10/03/2022 1:05:57 PM    ASSESSMENT: Mitral valve prolapse with severe eccentric regurgitation, asymptomatic Palpitations Left atrial enlargement PAT or paroxysmal atrial flutter Chest pain Headaches with migraine features Anxiety  PLAN: 1.   Mrs. Gally was found to have severe eccentric regurgitation based on TEE earlier this year.  She was sent to Dr. Leafy Ro with cardiac surgery felt that there was about a 90% chance of successful repair however 1 would have to consider replacement and at her young age  mechanical valve is likely.  She already has a child and is possibly considering having another but as she is asymptomatic largely at this point he suggested watchful waiting.  She is having some intermittent tachycardia which she says is getting a little bit worse but not frequent enough where she feels that she needs to be on medication.  If this worsens we could put her on beta-blocker which could be used safely during pregnancy if necessary.  Plan follow-up with me in 6 months or sooner if necessary.  [name], MD, [facility], FACP  [place]  [facility]  Medical Director of the Advanced Lipid Disorders &  Cardiovascular Risk Reduction Clinic Diplomate of the American Board of Clinical Lipidology Attending Cardiologist  Direct Dial: [phone]  Fax: [phone]  Website:  [email or web address] Gavin 10/03/2022, 1:06 PM

## 2022-10-03 NOTE — Patient Instructions (Signed)
Medication Instructions:  NO CHANGES  *If you need a refill on your cardiac medications before your next appointment, please call your pharmacy*    Follow-Up: At [facility], you and your health needs are our priority.  As part of our continuing mission to provide you with exceptional heart care, we have created designated Provider Care Teams.  These Care Teams include your primary Cardiologist (physician) and Advanced Practice Providers (APPs -  Physician Assistants and Nurse Practitioners) who all work together to provide you with the care you need, when you need it.  We recommend signing up for the patient portal called "MyChart".  Sign up information is provided on this After Visit Summary.  MyChart is used to connect with patients for Virtual Visits (Telemedicine).  Patients are able to view lab/test results, encounter notes, upcoming appointments, etc.  Non-urgent messages can be sent to your provider as well.   To learn more about what you can do with MyChart, go to [email or web address].    Your next appointment:    6 months with Dr. Debara Pickett

## 2022-10-15 DIAGNOSIS — Z01419 Encounter for gynecological examination (general) (routine) without abnormal findings: Secondary | ICD-10-CM | POA: Diagnosis not present

## 2022-10-31 ENCOUNTER — Telehealth: Payer: Self-pay | Admitting: Genetic Counselor

## 2022-11-09 ENCOUNTER — Telehealth: Payer: Self-pay | Admitting: Genetic Counselor

## 2022-11-15 ENCOUNTER — Encounter: Payer: BC Managed Care – PPO | Admitting: Genetic Counselor

## 2022-11-15 ENCOUNTER — Other Ambulatory Visit: Payer: BC Managed Care – PPO

## 2022-11-19 DIAGNOSIS — L03019 Cellulitis of unspecified finger: Secondary | ICD-10-CM | POA: Diagnosis not present

## 2022-12-02 DIAGNOSIS — J069 Acute upper respiratory infection, unspecified: Secondary | ICD-10-CM | POA: Diagnosis not present

## 2022-12-06 ENCOUNTER — Inpatient Hospital Stay: Payer: BC Managed Care – PPO

## 2022-12-06 ENCOUNTER — Inpatient Hospital Stay: Payer: BC Managed Care – PPO | Attending: Internal Medicine | Admitting: Genetic Counselor

## 2022-12-06 DIAGNOSIS — Z803 Family history of malignant neoplasm of breast: Secondary | ICD-10-CM | POA: Diagnosis not present

## 2022-12-06 DIAGNOSIS — Z8042 Family history of malignant neoplasm of prostate: Secondary | ICD-10-CM

## 2022-12-06 DIAGNOSIS — Z800 Family history of malignant neoplasm of digestive organs: Secondary | ICD-10-CM | POA: Diagnosis not present

## 2022-12-10 ENCOUNTER — Encounter: Payer: Self-pay | Admitting: Genetic Counselor

## 2022-12-10 DIAGNOSIS — Z803 Family history of malignant neoplasm of breast: Secondary | ICD-10-CM | POA: Insufficient documentation

## 2022-12-10 DIAGNOSIS — Z9189 Other specified personal risk factors, not elsewhere classified: Secondary | ICD-10-CM | POA: Insufficient documentation

## 2022-12-10 HISTORY — DX: Other specified personal risk factors, not elsewhere classified: Z91.89

## 2022-12-10 NOTE — Progress Notes (Signed)
REFERRING PROVIDER: Steva Ready, DO [address] [place],  [place] [address]  PRIMARY PROVIDER:  Olive Bass, FNP  PRIMARY REASON FOR VISIT:  1. Family history of breast cancer   2. Family history of colon cancer   3. Family history of prostate cancer     HISTORY OF PRESENT ILLNESS:   Alicia Kelley, a 32 y.o. female, was seen for a [place] cancer genetics consultation at the request of Dr. Connye Burkitt due to a family history of breast and other cancers.  Ms. Bartoszek presents to clinic today to discuss the possibility of a hereditary predisposition to cancer, to discuss genetic testing, and to further clarify her future cancer risks, as well as potential cancer risks for family members.   Ms. Bradburn is a 32 y.o. female with no personal history of cancer.    CANCER HISTORY:  Oncology History   No history exists.    RISK FACTORS:  Mammogram within the last year: no Number of breast biopsies: 0. Colonoscopy: no; not examined. Hysterectomy: no.  Ovaries intact: yes.  Menarche was at age 30 or 51.  First live birth at age 83.  Menopausal status: premenopausal. OCP use for approximately 10 years.  HRT use: 0 years.  Past Medical History:  Diagnosis Date   H/O mitral valve prolapse    moderate regurgitation, mild left atrial enlargement   Mitral valve prolapse     Past Surgical History:  Procedure Laterality Date   NO PAST SURGERIES     TEE WITHOUT CARDIOVERSION N/A 06/15/2022   Procedure: TRANSESOPHAGEAL ECHOCARDIOGRAM;  Surgeon: Jodelle Red, MD;  Location: [facility] INVASIVE CV LAB;  Service: Cardiovascular;  Laterality: N/A;     FAMILY HISTORY:  We obtained a detailed, 4-generation family history.  Significant diagnoses are listed below: Family History  Problem Relation Age of Onset   Breast cancer Paternal Aunt        dx <50   Prostate cancer Maternal Grandfather        dx 81s; no mets   Breast cancer Paternal Grandmother 74   Colon cancer  Paternal Grandfather        dx <50   Prostate cancer Paternal Grandfather     Ms. Borstad is unaware of previous family history of genetic testing for hereditary cancer risks.  There is no reported Ashkenazi Jewish ancestry. There is no known consanguinity.  GENETIC COUNSELING ASSESSMENT: Ms. Rebel is a 32 y.o. female with a family history of cancer which is somewhat suggestive of a hereditary cancer syndrome and predisposition to cancer given the presence of premenopausal breast and related cancers in multiple generations of her family. We, therefore, discussed and recommended the following at today's visit.   DISCUSSION:   Genetic testing:  We discussed that 5 - 10% of cancer is hereditary, with most cases of hereditary breast cancer associated with mutations in BRCA1/2.  There are other genes that can be associated with hereditary breast, colon, and/or prostate cancer syndromes.  We discussed that testing is beneficial for several reasons, including knowing about other cancer risks, identifying potential screening and risk-reduction options that may be appropriate, and understanding if other family members could be at risk for cancer and allowing them to undergo genetic testing.  We reviewed the characteristics, features and inheritance patterns of hereditary cancer syndromes. We also discussed genetic testing, including the appropriate family members to test, the process of testing, insurance coverage and turn-around-time for results. We discussed the implications of a negative, positive,  and variant of uncertain significant result. We discussed that negative results would be uninformative given that Ms. Weidner does not have a personal history of cancer.  We discussed that her paternal aunt, who has a history of breast cancer before age 84, as well as her father, who is more closely related to his parents who have a cancer history, are more informative relatives to test.  Given these relatives are  unavailable for testing at this time, we recommended Ms. Paris pursue genetic testing for a panel that contains genes associated with breast, colon, prostate and other cancers.  Based on Ms. Coventry's family history of breast cancer before age 9, she meets NCCN criteria for genetic testing.   We discussed the Genetic Information Non-Discrimination Act (GINA) of 2008, which helps protect individuals against genetic discrimination based on their genetic test results.  It impacts both health insurance and employment.  With health insurance, it protects against genetic test results being used for increased premiums or policy termination. For employment, it protects against hiring, firing and promoting decisions based on genetic test results.  GINA does not apply to those in the [employer], those who work for companies with less than 15 employees, and new life insurance or long-term disability insurance policies.  Health status due to a cancer diagnosis is not protected under GINA.   Risk model:   Ms. Hanni has been determined to be at high risk for breast cancer.  Her lifetime risk for breast cancer based on Tyrer-Cuzick risk model is 23.7%.  This risk estimate can change over time and may be repeated to reflect new information in her personal or family history in the future.  For females with a greater than 20% lifetime risk of breast cancer, the [employer] (NCCN) recommends the following:  Clinical encounter every 6-12 months to begin when identified as being at increased risk, but not before age 68  Annual mammograms. Tomosynthesis is recommended starting 10 years earlier than the youngest breast cancer diagnosis in the family or at age 62 (whichever comes first), but not before age 21   Annual breast MRI starting 10 years earlier than the youngest breast cancer diagnosis in the family or at age 58 (whichever comes first), but not before age 7     In addition to a yearly  mammogram and physical exam by a healthcare provider, she should discuss the usefulness of an annual breast MRI with her referring provider.   She is now 10 years younger than the youngest diagnosis of breast cancer in the family (paternal aunt and paternal grandmother dx ~37); therefore, she should discuss beginning breast imaging with her providers. Ms. Binion is not interested in a referral to the high risk breast clinic at this time.  She can reach out if she's interested in a referral in the future.    PLAN: Ms. Smethurst did not wish to pursue genetic testing at today's visit due to GINA considerations.  She also wishes to speak with her father and paternal aunt to see if they are interested in genetic testing, as they are more informative relatives to test.  We understand this decision and remain available to coordinate genetic testing at any time in the future, especially her aunt and father do not wish to have testing or if they have positive genetic testing results.   We, therefore, recommend Ms. Verdine continue to follow the cancer screening guidelines given by her primary healthcare provider.  She should speak with Dr. Connye Burkitt  about starting breast imaging (mammograms with consideration for breast MRIs) given her Tyrer Cuzick score.   Lastly, we encouraged Ms. Maddocks to remain in contact with cancer genetics so that we can continuously update the family history and inform her of any changes in cancer genetics and testing that may be of benefit for this family.   Ms. Gassler questions were answered to her satisfaction today. Our contact information was provided should additional questions or concerns arise. Thank you for the referral and allowing [place] to share in the care of your patient.   Lyndall Bellot M. Rennie Plowman, MS, [facility] Genetic Counselor [email or web address] [phone]   The patient was seen for a total of 40 minutes in face-to-face genetic counseling.  The patient was seen alone.  Drs.  Gunnar Bulla and/or Mosetta Putt were available to discuss this case as needed.  _______________________________________________________________________ For Office Staff:  Number of people involved in session: 1 Was an Intern/ student involved with case: no

## 2022-12-13 ENCOUNTER — Encounter: Payer: Self-pay | Admitting: Family

## 2022-12-13 ENCOUNTER — Ambulatory Visit: Payer: BC Managed Care – PPO | Admitting: Family

## 2022-12-13 VITALS — BP 112/70 | HR 80 | Temp 98.2°F | Resp 18 | Ht 68.0 in | Wt 175.0 lb

## 2022-12-13 DIAGNOSIS — J019 Acute sinusitis, unspecified: Secondary | ICD-10-CM

## 2022-12-13 MED ORDER — AMOXICILLIN-POT CLAVULANATE 875-125 MG PO TABS: 1.0000 | ORAL_TABLET | Freq: Two times a day (BID) | ORAL | 0 refills | Status: AC

## 2022-12-13 MED ORDER — FLUTICASONE PROPIONATE 50 MCG/ACT NA SUSP: 2.0000 | Freq: Every day | NASAL | 6 refills | Status: AC

## 2022-12-13 NOTE — Progress Notes (Signed)
Alicia Kelley is a 32 y.o. female with the following history as recorded in EpicCare:  Patient Active Problem List   Diagnosis Date Noted   Family history of breast cancer 12/10/2022   At high risk for breast cancer 12/10/2022   SVD (spontaneous vaginal delivery) 07/03/2020   Normal postpartum course 07/03/2020   Normal labor 07/02/2020   MVP (mitral valve prolapse) 02/01/2020   History of PAT (paroxysmal atrial tachycardia) 03/09/2015   Severe mitral regurgitation 12/22/2014   Migraine 12/22/2014    Current Outpatient Medications  Medication Sig Dispense Refill   acetaminophen (TYLENOL) 500 MG tablet Take 500 mg by mouth every 6 (six) hours as needed.     amoxicillin-clavulanate (AUGMENTIN) 875-125 MG tablet Take 1 tablet by mouth 2 (two) times daily for 7 days. 14 tablet 0   cetirizine (ZYRTEC) 10 MG tablet Take 10 mg by mouth daily as needed for allergies.     fluticasone (FLONASE) 50 MCG/ACT nasal spray Place 2 sprays into both nostrils daily. 16 g 6   Hydrocortisone (PREPARATION H EX) Apply 1 Application topically daily as needed (hemorrhoids).     ibuprofen (ADVIL) 200 MG tablet Take 400 mg by mouth every 6 (six) hours as needed for moderate pain.     norethindrone (MICRONOR) 0.35 MG tablet Take 1 tablet by mouth daily.     triamcinolone cream (KENALOG) 0.1 % Apply 1 Application topically 2 (two) times daily. (Patient taking differently: Apply 1 Application topically 2 (two) times daily as needed (irritation).) 453.6 g 0   No current facility-administered medications for this visit.    Allergies: Magnesium and Lidocaine  Past Medical History:  Diagnosis Date   At high risk for breast cancer 12/10/2022   Lifetime risk (based on Rockne Menghini) is 23.4% (calculated in November 2024) Recommended annual mammograms and consideration for annual breast MRIs    H/O mitral valve prolapse    moderate regurgitation, mild left atrial enlargement   Mitral valve prolapse     Past Surgical  History:  Procedure Laterality Date   NO PAST SURGERIES     TEE WITHOUT CARDIOVERSION N/A 06/15/2022   Procedure: TRANSESOPHAGEAL ECHOCARDIOGRAM;  Surgeon: Jodelle Red, MD;  Location[facility] INVASIVE CV LAB;  Service: Cardiovascular;  Laterality: N/A;    Family History  Problem Relation Age of Onset   Hypertension Mother    Healthy Father    Heart murmur Sister    Healthy Brother    Breast cancer Paternal Aunt        dx <50   Prostate cancer Maternal Grandfather        dx 83s; no mets   Breast cancer Paternal Grandmother 49   Colon cancer Paternal Grandfather        dx <50   Prostate cancer Paternal Grandfather     Social History   Tobacco Use   Smoking status: Never   Smokeless tobacco: Never  Substance Use Topics   Alcohol use: Never    Subjective:   Presents with concerns for possible sinus infection; symptoms started earlier this month; + sinus pressure/ cough;  Did teledoc visit in the past week- was given albuterol and Singulair; no chest pain or shortness of breath;   Objective:  Vitals:   12/13/22 1051  BP: 112/70  Pulse: 80  Resp: 18  Temp: 98.2 F (36.8 C)  TempSrc: Oral  SpO2: 97%  Weight: 175 lb (79.4 kg)  Height: 5\' 8"  (1.727 m)    General: Well developed, well nourished, in  no acute distress  Skin : Warm and dry.  Head: Normocephalic and atraumatic  Eyes: Sclera and conjunctiva clear; pupils round and reactive to light; extraocular movements intact  Ears: External normal; canals clear; tympanic membranes congested bilaterally Oropharynx: Pink, supple. No suspicious lesions  Neck: Supple without thyromegaly, adenopathy  Lungs: Respirations unlabored; clear to auscultation bilaterally without wheeze, rales, rhonchi  CVS exam: normal rate and regular rhythm.  Neurologic: Alert and oriented; speech intact; face symmetrical; moves all extremities well; CNII-XII intact without focal deficit   Assessment:  1. Acute sinusitis, recurrence not  specified, unspecified location     Plan:  Rx for Augmentin and Flonase- take as directed; if no improvement by early next week, please call back and will add oral prednisone; increase fluids, rest and follow up worse, no better.   No follow-ups on file.  No orders of the defined types were placed in this encounter.   Requested Prescriptions   Signed Prescriptions Disp Refills   amoxicillin-clavulanate (AUGMENTIN) 875-125 MG tablet 14 tablet 0    Sig: Take 1 tablet by mouth 2 (two) times daily for 7 days.   fluticasone (FLONASE) 50 MCG/ACT nasal spray 16 g 6    Sig: Place 2 sprays into both nostrils daily.

## 2023-01-09 DIAGNOSIS — J029 Acute pharyngitis, unspecified: Secondary | ICD-10-CM | POA: Diagnosis not present

## 2023-01-11 ENCOUNTER — Ambulatory Visit
Admission: RE | Admit: 2023-01-11 | Discharge: 2023-01-11 | Disposition: A | Discharge status: Home / Self Care | Payer: BC Managed Care – PPO | Source: Ambulatory Visit | Attending: Family Medicine | Admitting: Family Medicine

## 2023-01-11 ENCOUNTER — Other Ambulatory Visit: Payer: Self-pay

## 2023-01-11 VITALS — BP 112/78 | HR 72 | Temp 97.8°F | Resp 19

## 2023-01-11 DIAGNOSIS — J069 Acute upper respiratory infection, unspecified: Secondary | ICD-10-CM | POA: Diagnosis not present

## 2023-01-11 LAB — POCT RAPID STREP A (OFFICE): Rapid Strep A Screen: NEGATIVE

## 2023-01-11 LAB — POCT INFLUENZA A/B
Influenza A, POC: NEGATIVE
Influenza B, POC: NEGATIVE

## 2023-01-11 MED ORDER — DOXYCYCLINE HYCLATE 100 MG PO CAPS: ORAL_CAPSULE | ORAL | 0 refills | Status: DC

## 2023-01-11 NOTE — ED Provider Notes (Signed)
[facility]    CSN: [id number] Arrival date & time: 01/11/23  1026      History   Chief Complaint Chief Complaint  Patient presents with   Sore Throat    Symptoms: sore throat, Scratchy throat congestion, coughing, Sneezing, headache, sniffles.My son tested positive for strep two days ago. - Entered by patient    HPI Alicia Kelley is a 32 y.o. female.   HPI  Past Medical History:  Diagnosis Date   At high risk for breast cancer 12/10/2022   Lifetime risk (based on Rockne Menghini) is 23.4% (calculated in November 2024) Recommended annual mammograms and consideration for annual breast MRIs    H/O mitral valve prolapse    moderate regurgitation, mild left atrial enlargement   Mitral valve prolapse     Patient Active Problem List   Diagnosis Date Noted   Family history of breast cancer 12/10/2022   At high risk for breast cancer 12/10/2022   SVD (spontaneous vaginal delivery) 07/03/2020   Normal postpartum course 07/03/2020   Normal labor 07/02/2020   MVP (mitral valve prolapse) 02/01/2020   History of PAT (paroxysmal atrial tachycardia) 03/09/2015   Severe mitral regurgitation 12/22/2014   Migraine 12/22/2014    Past Surgical History:  Procedure Laterality Date   NO PAST SURGERIES     TEE WITHOUT CARDIOVERSION N/A 06/15/2022   Procedure: TRANSESOPHAGEAL ECHOCARDIOGRAM;  Surgeon: Jodelle Red, MD;  Location: [facility] INVASIVE CV LAB;  Service: Cardiovascular;  Laterality: N/A;    OB History     Gravida  1   Para  1   Term  1   Preterm      AB      Living  1      SAB      IAB      Ectopic      Multiple  0   Live Births  1            Home Medications    Prior to Admission medications   Medication Sig Start Date End Date Taking? Authorizing Provider  doxycycline (VIBRAMYCIN) 100 MG capsule Take one cap PO Q12hr with food. 01/11/23  Yes Lattie Haw, MD  acetaminophen (TYLENOL) 500 MG tablet Take 500 mg by mouth every  6 (six) hours as needed.    [provider]  cetirizine (ZYRTEC) 10 MG tablet Take 10 mg by mouth daily as needed for allergies.    [provider]  fluticasone (FLONASE) 50 MCG/ACT nasal spray Place 2 sprays into both nostrils daily. 12/13/22   Olive Bass, FNP  Hydrocortisone (PREPARATION H EX) Apply 1 Application topically daily as needed (hemorrhoids).    [provider]  ibuprofen (ADVIL) 200 MG tablet Take 400 mg by mouth every 6 (six) hours as needed for moderate pain.    [provider]  norethindrone (MICRONOR) 0.35 MG tablet Take 1 tablet by mouth daily. 09/28/21   [provider]  triamcinolone cream (KENALOG) 0.1 % Apply 1 Application topically 2 (two) times daily. Patient taking differently: Apply 1 Application topically 2 (two) times daily as needed (irritation). 12/01/21   Olive Bass, FNP    Family History Family History  Problem Relation Age of Onset   Hypertension Mother    Healthy Father    Heart murmur Sister    Healthy Brother    Breast cancer Paternal Aunt        dx <50   Prostate cancer Maternal Grandfather  dx 30s; no mets   Breast cancer Paternal Grandmother 92   Colon cancer Paternal Grandfather        dx <50   Prostate cancer Paternal Grandfather     Social History Social History   Tobacco Use   Smoking status: Never   Smokeless tobacco: Never  Vaping Use   Vaping status: Never Used  Substance Use Topics   Alcohol use: Never   Drug use: Never     Allergies   Magnesium and Lidocaine   Review of Systems Review of Systems   Physical Exam Triage Vital Signs ED Triage Vitals  Encounter Vitals Group     BP 01/11/23 1048 112/78     Systolic BP Percentile --      Diastolic BP Percentile --      Pulse Rate 01/11/23 1048 72     Resp 01/11/23 1048 19     Temp 01/11/23 1048 97.8 F (36.6 C)     Temp Source 01/11/23 1048 Oral     SpO2 01/11/23 1048 98 %     Weight --       Height --      Head Circumference --      Peak Flow --      Pain Score 01/11/23 1047 6     Pain Loc --      Pain Education --      Exclude from Growth Chart --    No data found.  Updated Vital Signs BP 112/78   Pulse 72   Temp 97.8 F (36.6 C) (Oral)   Resp 19   LMP 01/03/2023 (Exact Date)   SpO2 98%   Visual Acuity Right Eye Distance:   Left Eye Distance:   Bilateral Distance:    Right Eye Near:   Left Eye Near:    Bilateral Near:     Physical Exam   UC Treatments / Results  Labs (all labs ordered are listed, but only abnormal results are displayed) Labs Reviewed  POCT INFLUENZA A/B - Normal  POCT RAPID STREP A (OFFICE)  POC SARS CORONAVIRUS 2 AG -  ED    EKG   Radiology No results found.  Procedures Procedures (including critical care time)  Medications Ordered in UC Medications - No data to display  Initial Impression / Assessment and Plan / UC Course  I have reviewed the triage vital signs and the nursing notes.  Pertinent labs & imaging results that were available during my care of the patient were reviewed by me and considered in my medical decision making (see chart for details).    Benign exam. There is no evidence of bacterial infection today.   Treat symptomatically for now  Begin doxycycline if not improved about one week (Given a prescription to hold, with an expiration date)  Followup with Family Doctor if not improved in about 10 days.  Final Clinical Impressions(s) / UC Diagnoses   Final diagnoses:  Viral URI with cough     Discharge Instructions      Take plain guaifenesin (1200mg  extended release tabs such as Mucinex) twice daily, with plenty of water, for cough and congestion.  May add Pseudoephedrine (30mg , one or two every 4 to 6 hours) for sinus congestion.  Get adequate rest.   May use Afrin nasal spray (or generic oxymetazoline) each morning for about 5 days and then discontinue.  Also recommend using saline nasal spray  several times daily and saline nasal irrigation (AYR is a common brand).  Use Flonase nasal spray each morning after using Afrin nasal spray and saline nasal irrigation. May take Delsym Cough Suppressant ("12 Hour Cough Relief") at bedtime for nighttime cough.  Try warm salt water gargles for sore throat.  Stop all antihistamines for now, and other non-prescription cough/cold preparations. May take Tylenol as needed for fever, headache, etc. Begin Doxycycline if not improving about one week or if persistent fever develops    If symptoms become significantly worse during the night or over the weekend, proceed to the local emergency room.     ED Prescriptions     Medication Sig Dispense Auth. Provider   doxycycline (VIBRAMYCIN) 100 MG capsule Take one cap PO Q12hr with food. 14 capsule Lattie Haw, MD      PDMP not reviewed this encounter.

## 2023-01-11 NOTE — Discharge Instructions (Signed)
Take plain guaifenesin (1200mg  extended release tabs such as Mucinex) twice daily, with plenty of water, for cough and congestion.  May add Pseudoephedrine (30mg , one or two every 4 to 6 hours) for sinus congestion.  Get adequate rest.   May use Afrin nasal spray (or generic oxymetazoline) each morning for about 5 days and then discontinue.  Also recommend using saline nasal spray several times daily and saline nasal irrigation (AYR is a common brand).  Use Flonase nasal spray each morning after using Afrin nasal spray and saline nasal irrigation. May take Delsym Cough Suppressant ("12 Hour Cough Relief") at bedtime for nighttime cough.  Try warm salt water gargles for sore throat.  Stop all antihistamines for now, and other non-prescription cough/cold preparations. May take Tylenol as needed for fever, headache, etc. Begin Doxycycline if not improving about one week or if persistent fever develops    If symptoms become significantly worse during the night or over the weekend, proceed to the local emergency room.

## 2023-01-11 NOTE — ED Triage Notes (Addendum)
Pt presents to uc with co of sore throat, runny nose, congestion since Sunday. Pt reports her child has strep currently. Pt has been taking motrin otc   Pt request covid and flu testing if strep is neg.

## 2023-01-18 ENCOUNTER — Ambulatory Visit: Payer: BC Managed Care – PPO | Admitting: Family

## 2023-01-24 DIAGNOSIS — L219 Seborrheic dermatitis, unspecified: Secondary | ICD-10-CM | POA: Diagnosis not present

## 2023-01-24 DIAGNOSIS — L301 Dyshidrosis [pompholyx]: Secondary | ICD-10-CM | POA: Diagnosis not present

## 2023-02-02 IMAGING — US US MFM FETAL BPP W/O NON-STRESS
1 series · 15 of 28 positions shown · non-contrast
Comparison: none

[Series 1: us mfm fetal bpp w/o non-stress · 32 acquisitions, 15 frames shown]
[im 1/32]
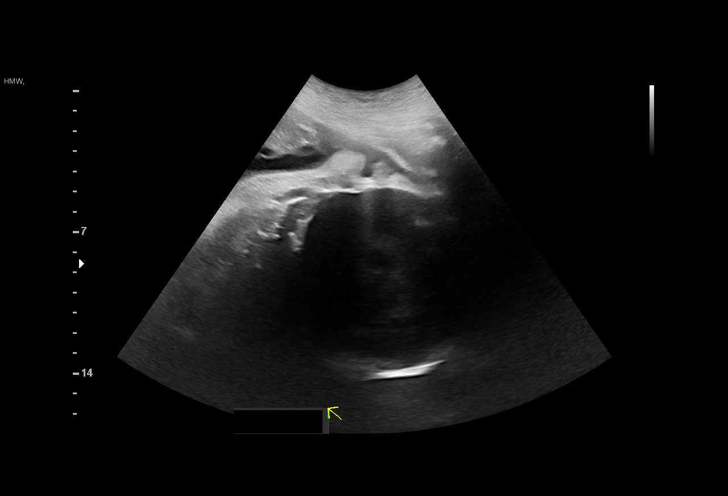
[im 3/32]
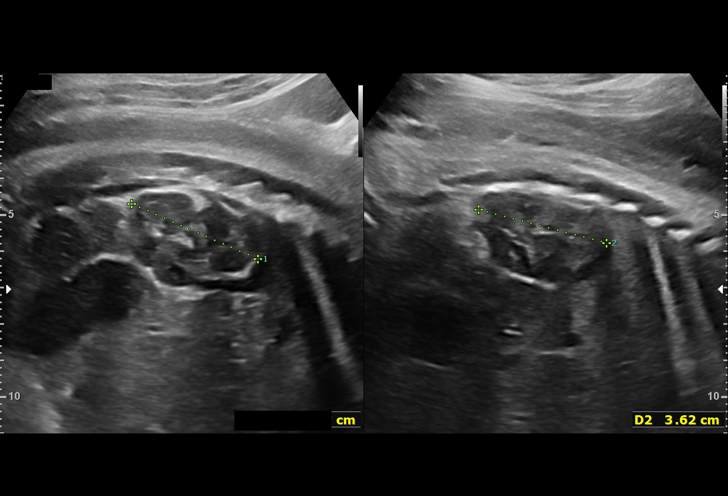
[im 5/32]
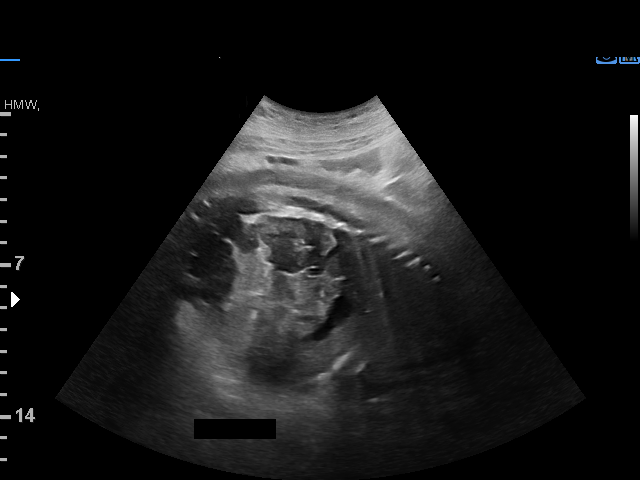
[im 7/32]
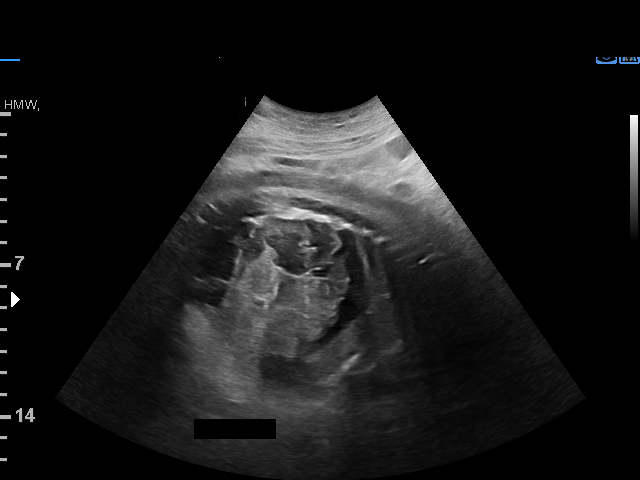
[im 10/32]
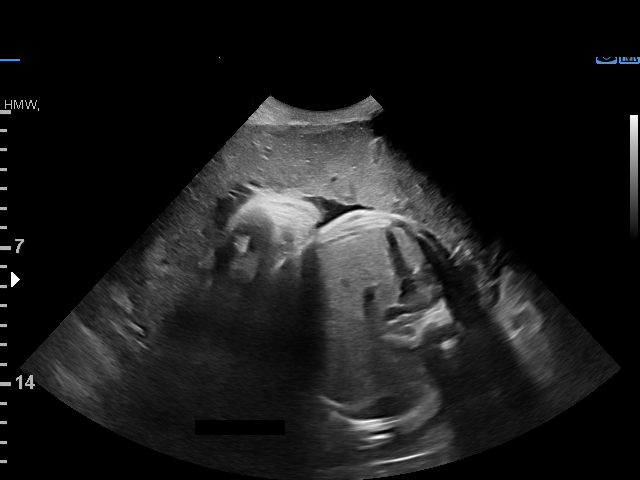
[im 12/32]
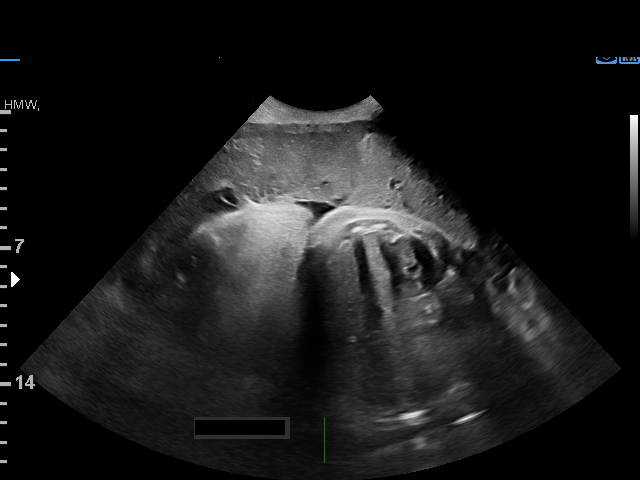
[im 14/32]
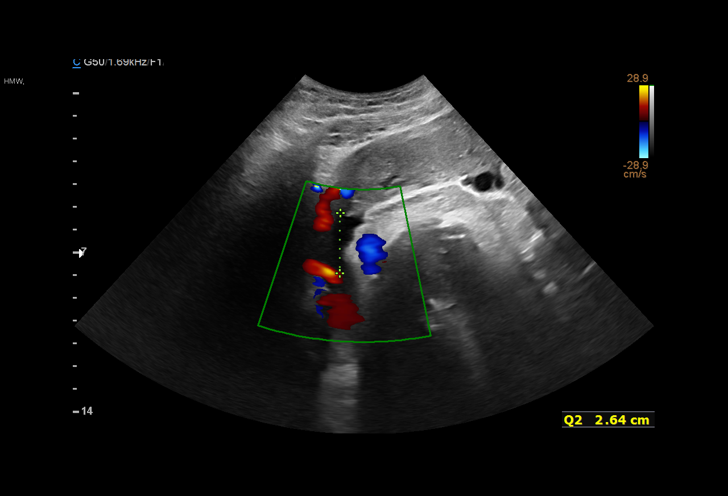
[im 17/32]
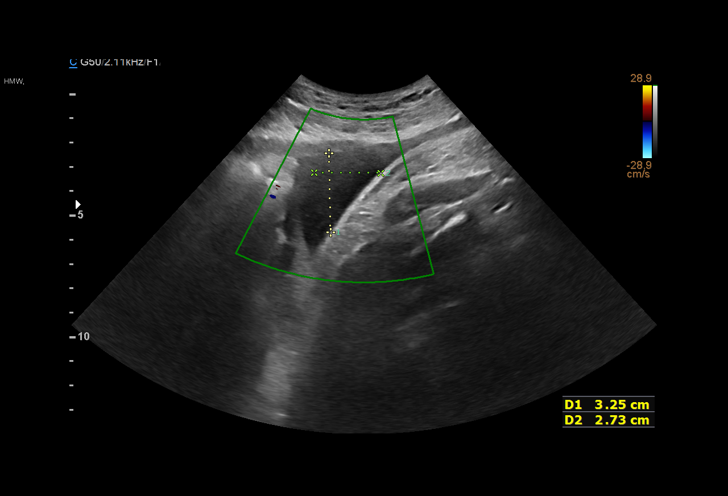
[im 18/32]
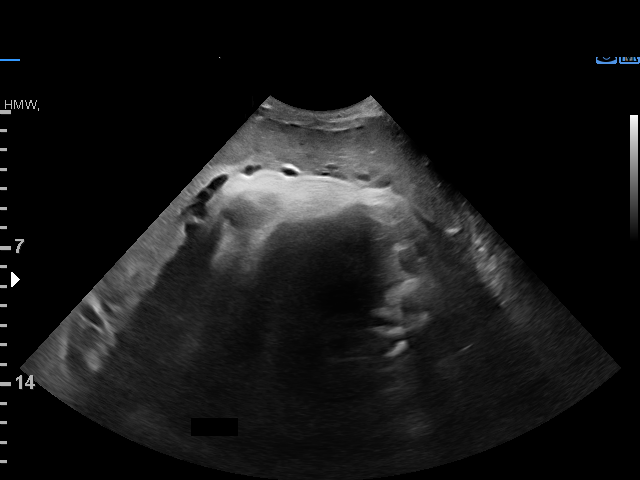
[im 20/32]
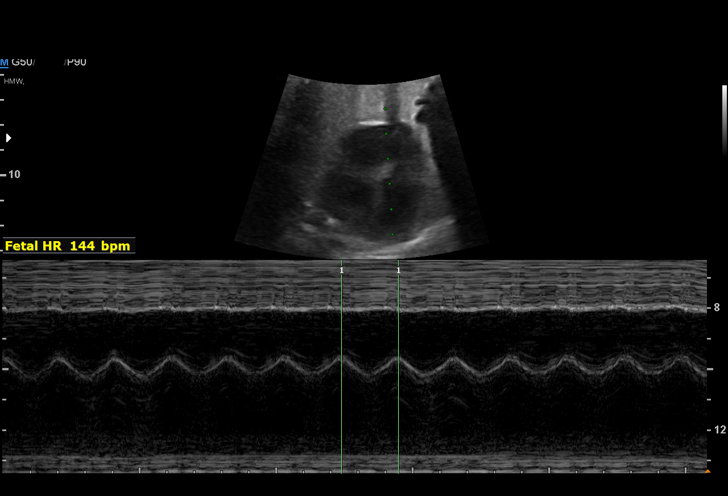
[im 22/32]
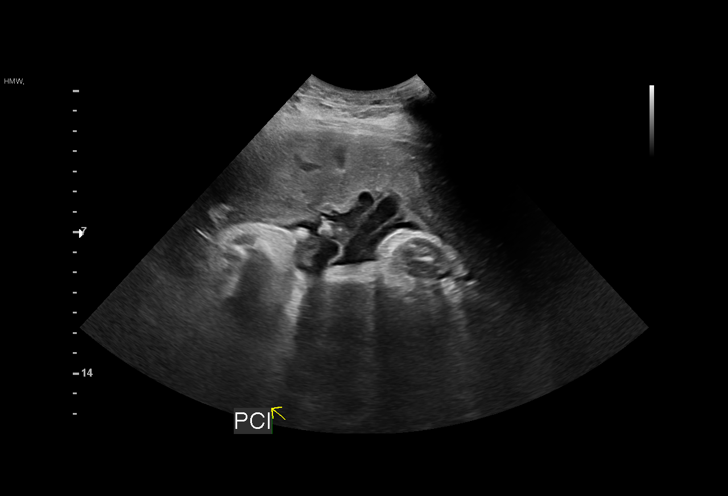
[im 25/32]
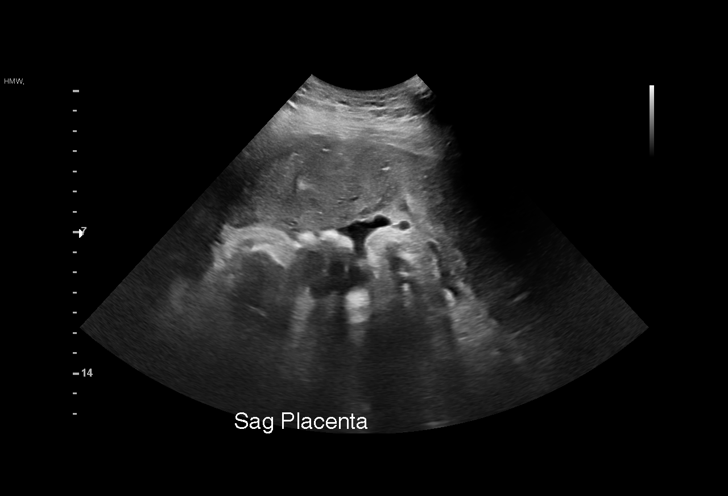
[im 27/32]
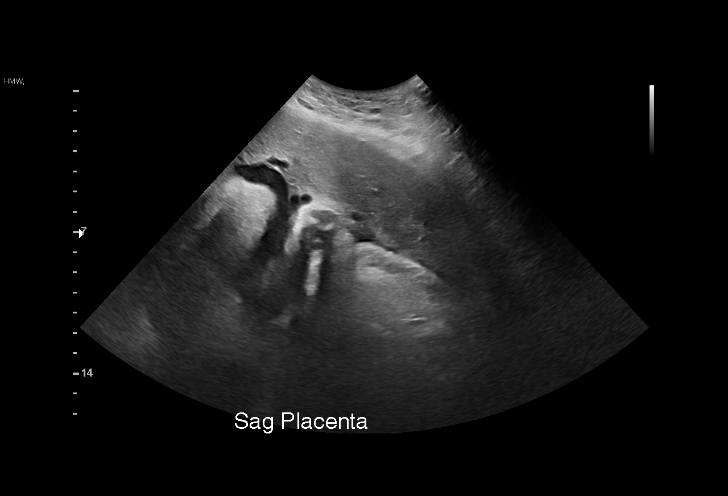
[im 29/32]
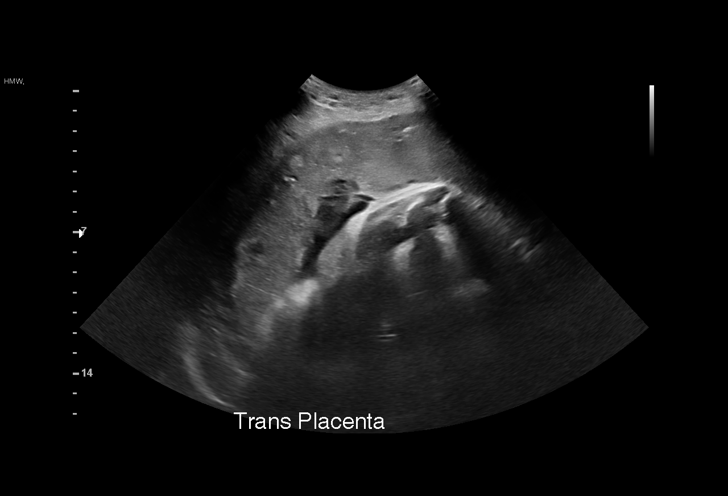
[im 32/32]
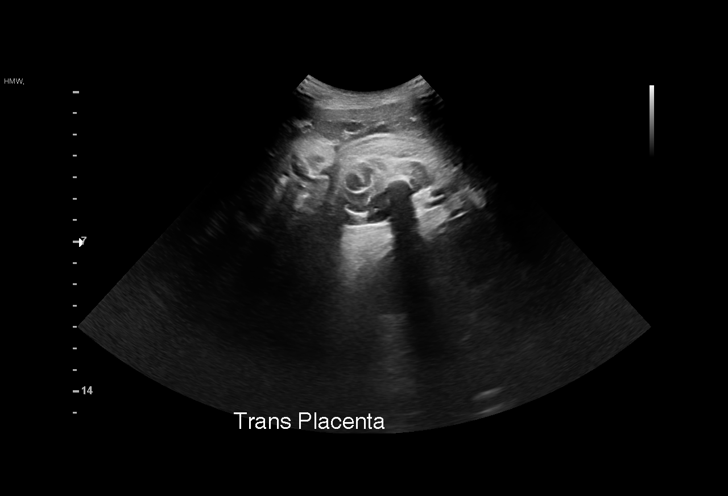

[15 of 28 positions shown; findings below may reference images not displayed]

OB/GYN
                                                             [id number]
                                                             [place]
                                                             [place]
                   [facility]                                       [HOSPITAL]

Indications

 39 weeks gestation of pregnancy
 Decreased fetal movements, third trimester,
 unspecified
 Encounter for antenatal screening,
 unspecified
Fetal Evaluation

 Num Of Fetuses:          1
 Fetal Heart Rate(bpm):   144
 Cardiac Activity:        Observed
 Presentation:            Cephalic
 Placenta:                Anterior
 P. Cord Insertion:       Visualized, central

 Amniotic Fluid
 AFI FV:      Within normal limits

 AFI Sum(cm)     %Tile       Largest Pocket(cm)
 11.2            40

 RUQ(cm)       RLQ(cm)       LUQ(cm)        LLQ(cm)

Biophysical Evaluation
 Amniotic F.V:   Within normal limits       F. Tone:         Observed
 F. Movement:    Observed                   Score:           [DATE]
 F. Breathing:   Observed
Gestational Age

 Clinical EDD:  39w 5d                                        EDD:   07/03/20
 Best:          39w 5d     Det. By:  Clinical EDD             EDD:   07/03/20
Anatomy

 Thoracic:              Appears normal         Kidneys:                Appear normal
 Stomach:               Appears normal, left   Bladder:                Appears normal
                        sided
Cervix Uterus Adnexa

 Cervix
 Not visualized (advanced GA >51wks)
Impression

 Antenatal testing performed given maternal decreased fetal
 movement
 The biophysical profile was [DATE] with good fetal movement and
 amniotic fluid volume.
Recommendations

 Clinical correlation recommended.

## 2023-04-01 DIAGNOSIS — R21 Rash and other nonspecific skin eruption: Secondary | ICD-10-CM | POA: Diagnosis not present

## 2023-04-03 DIAGNOSIS — L239 Allergic contact dermatitis, unspecified cause: Secondary | ICD-10-CM | POA: Diagnosis not present

## 2023-04-08 DIAGNOSIS — L239 Allergic contact dermatitis, unspecified cause: Secondary | ICD-10-CM | POA: Diagnosis not present

## 2023-04-11 ENCOUNTER — Ambulatory Visit: Payer: BC Managed Care – PPO | Attending: Internal Medicine | Admitting: Internal Medicine

## 2023-04-11 ENCOUNTER — Encounter: Payer: Self-pay | Admitting: Internal Medicine

## 2023-04-11 VITALS — BP 110/56 | HR 91 | Ht 68.0 in | Wt 177.0 lb

## 2023-04-11 DIAGNOSIS — R002 Palpitations: Secondary | ICD-10-CM | POA: Diagnosis not present

## 2023-04-11 DIAGNOSIS — I341 Nonrheumatic mitral (valve) prolapse: Secondary | ICD-10-CM | POA: Diagnosis not present

## 2023-04-11 DIAGNOSIS — I340 Nonrheumatic mitral (valve) insufficiency: Secondary | ICD-10-CM

## 2023-04-11 DIAGNOSIS — R0609 Other forms of dyspnea: Secondary | ICD-10-CM

## 2023-04-11 DIAGNOSIS — Z8679 Personal history of other diseases of the circulatory system: Secondary | ICD-10-CM

## 2023-04-11 NOTE — Patient Instructions (Addendum)
 Medication Instructions:  No changes   *If you need a refill on your cardiac medications before your next appointment, please call your pharmacy*   Lab Work: BMP BNP  If you have labs (blood work) drawn today and your tests are completely normal, you will receive your results only by: MyChart Message (if you have MyChart) OR A paper copy in the mail If you have any lab test that is abnormal or we need to change your treatment, we will call you to review the results.   Testing/Procedures:  Your physician has requested that you have an echocardiogram. Echocardiography is a painless test that uses sound waves to create images of your heart. It provides your doctor with information about the size and shape of your heart and how well your heart's chambers and valves are working. This procedure takes approximately one hour. There are no restrictions for this procedure. Please do NOT wear cologne, perfume, aftershave, or lotions (deodorant is allowed). Please arrive 15 minutes prior to your appointment time.  Please note: We ask at that you not bring children with you during ultrasound (echo/ vascular) testing. Due to room size and safety concerns, children are not allowed in the ultrasound rooms during exams. Our front office staff cannot provide observation of children in our lobby area while testing is being conducted. An adult accompanying a patient to their appointment will only be allowed in the ultrasound room at the discretion of the ultrasound technician under special circumstances. We apologize for any inconvenience.   Follow-Up: At [facility], you and your health needs are our priority.  As part of our continuing mission to provide you with exceptional heart care, we have created designated Provider Care Teams.  These Care Teams include your primary Cardiologist (physician) and Advanced Practice Providers (APPs -  Physician Assistants and Nurse Practitioners) who all work together  to provide you with the care you need, when you need it.     Your next appointment:   6 month(s)  The format for your next appointment:   In Person  Provider:   [name], MD

## 2023-04-11 NOTE — Progress Notes (Signed)
 OFFICE NOTE  Chief Complaint:  Follow-up mitral regurgitation  Primary Care Physician: Olive Bass, FNP  HPI:  Alicia Kelley is a 33 year old female who recently graduated from [employer] and [employer]. 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 Her past medical history is significant for heart murmur which was detected at a young age. She side do cardiologist in [place] who was a pediatric cardiologist. She apparently had an echo in the past and described a valvular abnormality which sounds like mitral valve prolapse. Subsequently she transitioned to a cardiologist in [place] but cannot remember the name of the cardiologist. She's had an echocardiogram before but no records of that are available. Recently she's been having a number of symptoms which are concerning. She is reporting chest pain, shortness of breath, particularly walking up stairs, headache which sounds migrainous in nature-there is photophobia and phonophobia with associated nausea and light sensitivity. In addition she is reporting palpitations. Those occur mostly at night. Her chest pain is sharp and not necessarily associated with exertion. It seems to last for only a few minutes. She does report some anxiety particularly about the symptoms. She recently had screening blood work and physical through her primary care provider. Cholesterol appears to be well-controlled. Her EKG shows sinus rhythm but there are some nonspecific ST and T-wave changes. Family history is significant for murmur in her brother and father.  I the pleasure see Alicia Kelley back today in follow-up. She underwent an exercise treadmill stress test which was low risk however she had interim development of PAT or possibly atrial tachycardia during the procedure. This likely explains the palpitation she's been feeling. A repeat echo was performed which  shows normal LV function however she does have thickening and bileaflet prolapse of the mitral valve and moderate regurgitation. This is similar to the findings of her echo which was performed by [facility] pediatric cardiology in 2014. She was previously seeing Dr. Mayer Camel. The findings now indicate that she does have some left atrial enlargement which is mild and was not previously present.  08/19/2019  Alicia Kelley is here to reestablish cardiac care.  She is considered a new patient since I have not seen her in more than 3 years.  It sounds like overall she has been doing well until recently when she has developed some palpitations.  Those were more frequent several weeks ago.  She had changed her diet around some and was drinking a tea which apparently contained twice as much caffeine is coffee and I suspect this may have provoked her palpitations.  We had discovered that she has moderate mitral valve prolapse with mild to moderate mitral regurgitation and some left atrial enlargement by echo in 2016.  She is not had a repeat echo since then.  Overall she denies any chest pain or worsening shortness of breath with exertion.  10/03/2022  Alicia Kelley is seen today in follow-up.  She recently changed her name although has been married since 2020.  She was referred to see Dr. Milinda Antis for evaluation of severe mitral regurgitation.  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  He gave her possibly 90% chance of successful repair.  We then have to think about what options it would be for replacement if not successful and this could likely include mechanical valve given her younger age.  This would necessitate warfarin anticoagulation  and if she would consider having another child it could be an issue for her.  He felt that at this point since she was asymptomatic he would recommend monitoring further.  She returns today and continues to have no significant symptoms except with  marked exercise.  She does feel some fullness or tightness in her chest when exercising and heart rate gets up into about the 150s.  She gets some occasional palpitations otherwise but nothing sustained.  04/11/2023  Alicia Kelley returns today for follow-up of mitral regurgitation.  She saw Dr. Leafy Ro who felt that there was a 90% chance her valve could be repairable although given her young age there is a risk that she may need to make mechanical valve and would be on light lifelong anticoagulation.  This could be an issue as she is considering having another child.  She had been asymptomatic.  Over the fall and in early winter she had some viral illnesses related to her child at daycare.  She reports when she is sick she is very slow to recover and has been more short of breath.  Recently she has had to prop up her pillow more at night.  She gets short of breath when walking up stairs or carrying her child which sounds like she is developing some symptomatic mitral regurgitation.  PMHx:  Past Medical History:  Diagnosis Date   At high risk for breast cancer 12/10/2022   Lifetime risk (based on Rockne Menghini) is 23.4% (calculated in November 2024) Recommended annual mammograms and consideration for annual breast MRIs    H/O mitral valve prolapse    moderate regurgitation, mild left atrial enlargement   Mitral valve prolapse     Past Surgical History:  Procedure Laterality Date   NO PAST SURGERIES     TEE WITHOUT CARDIOVERSION N/A 06/15/2022   Procedure: TRANSESOPHAGEAL ECHOCARDIOGRAM;  Surgeon: Jodelle Red, MD;  Location: [facility] INVASIVE CV LAB;  Service: Cardiovascular;  Laterality: N/A;    FAMHx:  Family History  Problem Relation Age of Onset   Hypertension Mother    Healthy Father    Heart murmur Sister    Healthy Brother    Breast cancer Paternal Aunt        dx <50   Prostate cancer Maternal Grandfather        dx 57s; no mets   Breast cancer Paternal Grandmother 79    Colon cancer Paternal Grandfather        dx <50   Prostate cancer Paternal Grandfather     SOCHx:   reports that she has never smoked. She has never used smokeless tobacco. She reports that she does not drink alcohol and does not use drugs.  ALLERGIES:  Allergies  Allergen Reactions   Magnesium Palpitations   Lidocaine Swelling    Pt. Only reacts to the topical lidocaine     ROS: Pertinent items noted in HPI and remainder of comprehensive ROS otherwise negative.  HOME MEDS: Current Outpatient Medications  Medication Sig Dispense Refill   acetaminophen (TYLENOL) 500 MG tablet Take 500 mg by mouth every 6 (six) hours as needed.     cetirizine (ZYRTEC) 10 MG tablet Take 10 mg by mouth daily as needed for allergies.     doxycycline (VIBRAMYCIN) 100 MG capsule Take one cap PO Q12hr with food. 14 capsule 0   fluticasone (FLONASE) 50 MCG/ACT nasal spray Place 2 sprays into both nostrils daily. 16 g 6   Hydrocortisone (PREPARATION H EX) Apply 1 Application topically  daily as needed (hemorrhoids).     ibuprofen (ADVIL) 200 MG tablet Take 400 mg by mouth every 6 (six) hours as needed for moderate pain.     norethindrone (MICRONOR) 0.35 MG tablet Take 1 tablet by mouth daily.     triamcinolone cream (KENALOG) 0.1 % Apply 1 Application topically 2 (two) times daily. (Patient taking differently: Apply 1 Application topically 2 (two) times daily as needed (irritation).) 453.6 g 0   No current facility-administered medications for this visit.    LABS/IMAGING: No results found for this or any previous visit (from the past 48 hours). No results found.  WEIGHTS: Wt Readings from Last 3 Encounters:  12/13/22 175 lb (79.4 kg)  10/03/22 177 lb 9.6 oz (80.6 kg)  07/09/22 184 lb (83.5 kg)    VITALS: There were no vitals taken for this visit.  EXAM: General appearance: alert and no distress Neck: no carotid bruit, no JVD and thyroid not enlarged, symmetric, no  tenderness/mass/nodules Lungs: clear to auscultation bilaterally Heart: regular rate and rhythm, S1, S2 normal and systolic murmur: early systolic 3/6, blowing at apex Abdomen: soft, non-tender; bowel sounds normal; no masses,  no organomegaly Extremities: extremities normal, atraumatic, no cyanosis or edema Pulses: 2+ and symmetric Skin: Skin color, texture, turgor normal. No rashes or lesions Neurologic: Grossly normal Psych: Pleasant  EKG: EKG Interpretation Date/Time:  Thursday April 11 2023 13:30:18 EDT Ventricular Rate:  91 PR Interval:  166 QRS Duration:  92 QT Interval:  356 QTC Calculation: 437 R Axis:   46  Text Interpretation: Normal sinus rhythm with sinus arrhythmia Possible Left atrial enlargement Nonspecific T wave abnormality When compared with ECG of 03-Oct-2022 13:00, No significant change was found Confirmed by Alicia Kelley [phone]) on 04/11/2023 1:32:09 PM    ASSESSMENT: Mitral valve prolapse with severe eccentric regurgitation, now with progressive DOE and possible orthopnea Palpitations Left atrial enlargement PAT or paroxysmal atrial flutter Chest pain Headaches with migraine features Anxiety  PLAN: 1.   Alicia Kelley may be experiencing some progressive dyspnea on exertion and orthopnea.  She initially thought these were related to viral illnesses but says she has been slow to recover get short of breath walking upstairs and picking up her child.  This has been worse over the past several months.  I would like to update her echo today although she already has severe MR but it may be helpful if it shows higher filling pressures.  Will also get a metabolic profile and BNP.  Her exam does not show any clear volume overload or significant JVD or pulmonary edema.  If there are signs of worsening heart failure related to her mitral regurgitation, I would strongly suggest that she see Dr. Milinda Antis again to discuss surgical options.  We did discuss today that although not  ideal it could be possible that she could still have children even if she ultimately needed to be anticoagulated long-term.  Hopefully, since he quoted her a fairly high chance of successful repair, that it might be an option that she may have to take if she is becoming more symptomatic.  Alicia Nose, MD, [facility], FACP  [place]  [facility]  Medical Director of the Advanced Lipid Disorders &  Cardiovascular Risk Reduction Clinic Diplomate of the American Board of Clinical Lipidology Attending Cardiologist  Direct Dial: [phone]  Fax: [phone]  Website:  [email or web address]   Alicia Kelley 04/11/2023, 1:32 PM

## 2023-04-12 ENCOUNTER — Ambulatory Visit: Admitting: Family

## 2023-04-12 VITALS — BP 128/62 | HR 82 | Temp 98.5°F | Resp 18 | Ht 68.0 in | Wt 177.6 lb

## 2023-04-12 DIAGNOSIS — L259 Unspecified contact dermatitis, unspecified cause: Secondary | ICD-10-CM | POA: Diagnosis not present

## 2023-04-12 DIAGNOSIS — J209 Acute bronchitis, unspecified: Secondary | ICD-10-CM | POA: Diagnosis not present

## 2023-04-12 LAB — BASIC METABOLIC PANEL
BUN/Creatinine Ratio: 10 (ref 9–23)
BUN: 9 mg/dL (ref 6–20)
CO2: 23 mmol/L (ref 20–29)
Calcium: 9.6 mg/dL (ref 8.7–10.2)
Chloride: 101 mmol/L (ref 96–106)
Creatinine, Ser: 0.89 mg/dL (ref 0.57–1.00)
Glucose: 73 mg/dL (ref 70–99)
Potassium: 4.8 mmol/L (ref 3.5–5.2)
Sodium: 141 mmol/L (ref 134–144)
eGFR: 89 mL/min/{1.73_m2} (ref >59)

## 2023-04-12 LAB — BRAIN NATRIURETIC PEPTIDE: BNP: 15.8 pg/mL (ref 0.0–100.0)

## 2023-04-12 MED ORDER — AZITHROMYCIN 250 MG PO TABS: ORAL_TABLET | ORAL | 0 refills | Status: AC

## 2023-04-12 MED ORDER — PREDNISONE 20 MG PO TABS: 20.0000 mg | ORAL_TABLET | Freq: Every day | ORAL | 0 refills | Status: DC

## 2023-04-12 MED ORDER — ALBUTEROL SULFATE HFA 108 (90 BASE) MCG/ACT IN AERS: 1.0000 | INHALATION_SPRAY | Freq: Four times a day (QID) | RESPIRATORY_TRACT | 1 refills | Status: DC | PRN

## 2023-04-12 NOTE — Progress Notes (Signed)
 Alicia Kelley is a 33 y.o. female with the following history as recorded in EpicCare:  Patient Active Problem List   Diagnosis Date Noted   Family history of breast cancer 12/10/2022   At high risk for breast cancer 12/10/2022   SVD (spontaneous vaginal delivery) 07/03/2020   Normal postpartum course 07/03/2020   Normal labor 07/02/2020   MVP (mitral valve prolapse) 02/01/2020   History of PAT (paroxysmal atrial tachycardia) 03/09/2015   Severe mitral regurgitation 12/22/2014   Migraine 12/22/2014    Current Outpatient Medications  Medication Sig Dispense Refill   acetaminophen (TYLENOL) 500 MG tablet Take 500 mg by mouth every 6 (six) hours as needed.     azithromycin (ZITHROMAX) 250 MG tablet Take 2 tablets on day 1, then 1 tablet daily on days 2 through 5 6 tablet 0   cetirizine (ZYRTEC) 10 MG tablet Take 10 mg by mouth daily as needed for allergies.     desonide (DESOWEN) 0.05 % ointment Apply topically.     fluticasone (FLONASE) 50 MCG/ACT nasal spray Place 2 sprays into both nostrils daily. 16 g 6   Hydrocortisone (PREPARATION H EX) Apply 1 Application topically daily as needed (hemorrhoids).     ibuprofen (ADVIL) 200 MG tablet Take 400 mg by mouth every 6 (six) hours as needed for moderate pain.     norethindrone (MICRONOR) 0.35 MG tablet Take 1 tablet by mouth daily.     predniSONE (DELTASONE) 20 MG tablet Take 1 tablet (20 mg total) by mouth daily with breakfast. 5 tablet 0   tretinoin (RETIN-A) 0.025 % cream Apply topically.     triamcinolone cream (KENALOG) 0.1 % Apply 1 Application topically 2 (two) times daily. (Patient taking differently: Apply 1 Application topically 2 (two) times daily as needed (irritation).) 453.6 g 0   albuterol (VENTOLIN HFA) 108 (90 Base) MCG/ACT inhaler Inhale 1-2 puffs into the lungs every 6 (six) hours as needed for wheezing or shortness of breath. 6.7 g 1   No current facility-administered medications for this visit.    Allergies: Magnesium and  Lidocaine  Past Medical History:  Diagnosis Date   At high risk for breast cancer 12/10/2022   Lifetime risk (based on Rockne Menghini) is 23.4% (calculated in November 2024) Recommended annual mammograms and consideration for annual breast MRIs    H/O mitral valve prolapse    moderate regurgitation, mild left atrial enlargement   Mitral valve prolapse     Past Surgical History:  Procedure Laterality Date   NO PAST SURGERIES     TEE WITHOUT CARDIOVERSION N/A 06/15/2022   Procedure: TRANSESOPHAGEAL ECHOCARDIOGRAM;  Surgeon: Jodelle Red, MD;  Location: [facility] INVASIVE CV LAB;  Service: Cardiovascular;  Laterality: N/A;    Family History  Problem Relation Age of Onset   Hypertension Mother    Healthy Father    Heart murmur Sister    Healthy Brother    Breast cancer Paternal Aunt        dx <50   Prostate cancer Maternal Grandfather        dx 70s; no mets   Breast cancer Paternal Grandmother 55   Colon cancer Paternal Grandfather        dx <50   Prostate cancer Paternal Grandfather     Social History   Tobacco Use   Smoking status: Never   Smokeless tobacco: Never  Substance Use Topics   Alcohol use: Never    Subjective:   1 week history of cough/ congestion/ sore throat- has progressed  into coughing fits; +productive cough; no fever; not currently using albuterol or Flonase;     Objective:  Vitals:   04/12/23 1404  BP: 128/62  Pulse: 82  Resp: 18  Temp: 98.5 F (36.9 C)  TempSrc: Oral  SpO2: 99%  Weight: 177 lb 9.6 oz (80.6 kg)  Height: 5\' 8"  (1.727 m)    General: Well developed, well nourished, in no acute distress  Skin : Warm and dry.  Head: Normocephalic and atraumatic  Eyes: Sclera and conjunctiva clear; pupils round and reactive to light; extraocular movements intact  Ears: External normal; canals clear; tympanic membranes normal  Oropharynx: Pink, supple. No suspicious lesions  Neck: Supple without thyromegaly, adenopathy  Lungs: Respirations  unlabored; clear to auscultation bilaterally without wheeze, rales, rhonchi  CVS exam: normal rate and regular rhythm.  Neurologic: Alert and oriented; speech intact; face symmetrical; moves all extremities well; CNII-XII intact without focal deficit   Assessment:  1. Acute bronchitis, unspecified organism     Plan:  Suspect allergic component; Rx for Z-pak, Prednisone and albuterol; encouraged to re-start Flonase and continue during allergy season; increase fluids, rest and follow up worse, no better.   No follow-ups on file.  No orders of the defined types were placed in this encounter.   Requested Prescriptions   Signed Prescriptions Disp Refills   albuterol (VENTOLIN HFA) 108 (90 Base) MCG/ACT inhaler 6.7 g 1    Sig: Inhale 1-2 puffs into the lungs every 6 (six) hours as needed for wheezing or shortness of breath.   azithromycin (ZITHROMAX) 250 MG tablet 6 tablet 0    Sig: Take 2 tablets on day 1, then 1 tablet daily on days 2 through 5   predniSONE (DELTASONE) 20 MG tablet 5 tablet 0    Sig: Take 1 tablet (20 mg total) by mouth daily with breakfast.

## 2023-05-07 ENCOUNTER — Ambulatory Visit (HOSPITAL_COMMUNITY): Attending: Cardiology

## 2023-05-07 DIAGNOSIS — I341 Nonrheumatic mitral (valve) prolapse: Secondary | ICD-10-CM | POA: Diagnosis not present

## 2023-05-07 DIAGNOSIS — I340 Nonrheumatic mitral (valve) insufficiency: Secondary | ICD-10-CM | POA: Diagnosis not present

## 2023-05-07 LAB — ECHOCARDIOGRAM COMPLETE
Area-P 1/2: 4.49 cm2
MV M vel: 4.77 m/s
MV Peak grad: 91 mmHg
Radius: 1.2 cm
S' Lateral: 3.2 cm

## 2023-05-08 ENCOUNTER — Encounter: Payer: Self-pay | Admitting: Cardiovascular Disease

## 2023-06-12 DIAGNOSIS — N390 Urinary tract infection, site not specified: Secondary | ICD-10-CM | POA: Diagnosis not present

## 2023-07-11 ENCOUNTER — Ambulatory Visit: Admitting: Family

## 2023-07-11 ENCOUNTER — Encounter: Payer: Self-pay | Admitting: Family

## 2023-07-11 VITALS — BP 110/68 | HR 73 | Ht 68.0 in | Wt 179.8 lb

## 2023-07-11 DIAGNOSIS — L259 Unspecified contact dermatitis, unspecified cause: Secondary | ICD-10-CM | POA: Diagnosis not present

## 2023-07-11 DIAGNOSIS — J309 Allergic rhinitis, unspecified: Secondary | ICD-10-CM | POA: Diagnosis not present

## 2023-07-11 DIAGNOSIS — I341 Nonrheumatic mitral (valve) prolapse: Secondary | ICD-10-CM

## 2023-07-11 NOTE — Progress Notes (Signed)
 Alicia Kelley is a 33 y.o. female with the following history as recorded in EpicCare:  Patient Active Problem List   Diagnosis Date Noted   Family history of breast cancer 12/10/2022   At high risk for breast cancer 12/10/2022   SVD (spontaneous vaginal delivery) 07/03/2020   Normal postpartum course 07/03/2020   Normal labor 07/02/2020   MVP (mitral valve prolapse) 02/01/2020   History of PAT (paroxysmal atrial tachycardia) 03/09/2015   Severe mitral regurgitation 12/22/2014   Migraine 12/22/2014    Current Outpatient Medications  Medication Sig Dispense Refill   acetaminophen  (TYLENOL ) 500 MG tablet Take 500 mg by mouth every 6 (six) hours as needed.     albuterol  (VENTOLIN  HFA) 108 (90 Base) MCG/ACT inhaler Inhale 1-2 puffs into the lungs every 6 (six) hours as needed for wheezing or shortness of breath. 6.7 g 1   cetirizine (ZYRTEC) 10 MG tablet Take 10 mg by mouth daily as needed for allergies.     desonide (DESOWEN) 0.05 % ointment Apply topically.     fluticasone  (FLONASE ) 50 MCG/ACT nasal spray Place 2 sprays into both nostrils daily. 16 g 6   Hydrocortisone (PREPARATION H EX) Apply 1 Application topically daily as needed (hemorrhoids).     ibuprofen  (ADVIL ) 200 MG tablet Take 400 mg by mouth every 6 (six) hours as needed for moderate pain.     norethindrone (MICRONOR) 0.35 MG tablet Take 1 tablet by mouth daily.     triamcinolone  cream (KENALOG ) 0.1 % Apply 1 Application topically 2 (two) times daily. (Patient taking differently: Apply 1 Application topically 2 (two) times daily as needed (irritation).) 453.6 g 0   No current facility-administered medications for this visit.    Allergies: Ethylenediamine dihydrochloride [ethylenediamine], Magnesium, Nickel, and Lidocaine   Past Medical History:  Diagnosis Date   At high risk for breast cancer 12/10/2022   Lifetime risk (based on Barton Bos) is 23.4% (calculated in November 2024) Recommended annual mammograms and  consideration for annual breast MRIs    H/O mitral valve prolapse    moderate regurgitation, mild left atrial enlargement   Mitral valve prolapse     Past Surgical History:  Procedure Laterality Date   NO PAST SURGERIES     TEE WITHOUT CARDIOVERSION N/A 06/15/2022   Procedure: TRANSESOPHAGEAL ECHOCARDIOGRAM;  Surgeon: Sheryle Donning, MD;  Location: [facility] INVASIVE CV LAB;  Service: Cardiovascular;  Laterality: N/A;    Family History  Problem Relation Age of Onset   Hypertension Mother    Healthy Father    Heart murmur Sister    Healthy Brother    Breast cancer Paternal Aunt        dx <50   Prostate cancer Maternal Grandfather        dx 37s; no mets   Breast cancer Paternal Grandmother 51   Colon cancer Paternal Grandfather        dx <50   Prostate cancer Paternal Grandfather     Social History   Tobacco Use   Smoking status: Never   Smokeless tobacco: Never  Substance Use Topics   Alcohol use: Never    Subjective:   Requesting referral to allergist; has completed some testing with dermatology due to recurrent hand eczema but hoping to get more extensive testing done to rule out other sources that could cause her problems.  Objective:  Vitals:   07/11/23 1530  BP: 110/68  Pulse: 73  SpO2: 99%  Weight: 179 lb 12.8 oz (81.6 kg)  Height: 5' 8 (1.727  m)    General: Well developed, well nourished, in no acute distress  Skin : Warm and dry.  Head: Normocephalic and atraumatic  Eyes: Sclera and conjunctiva clear; pupils round and reactive to light; extraocular movements intact  Ears: External normal; canals clear; tympanic membranes normal  Oropharynx: Pink, supple. No suspicious lesions  Neck: Supple without thyromegaly, adenopathy  Lungs: Respirations unlabored;  Neurologic: Alert and oriented; speech intact; face symmetrical; moves all extremities well; CNII-XII intact without focal deficit   Assessment:  1. Contact dermatitis, unspecified contact dermatitis  type, unspecified trigger   2. Allergic rhinitis, unspecified seasonality, unspecified trigger   3. MVP (mitral valve prolapse)     Plan:  Referral to allergist as requested; Referral to cardiothoracic surgeon as discussed/ requested;   Follow up here as needed;   No follow-ups on file.  Orders Placed This Encounter  Procedures   Ambulatory referral to Allergy    Referral Priority:   Routine    Referral Type:   Allergy Testing    Referral Reason:   Specialty Services Required    Requested Specialty:   Allergy    Number of Visits Requested:   1   Ambulatory referral to Cardiothoracic Surgery    Referral Priority:   Routine    Referral Type:   Surgical    Referral Reason:   Specialty Services Required    Requested Specialty:   Cardiothoracic Surgery    Number of Visits Requested:   1    Requested Prescriptions    No prescriptions requested or ordered in this encounter

## 2023-07-16 ENCOUNTER — Other Ambulatory Visit: Payer: Self-pay

## 2023-07-16 DIAGNOSIS — I341 Nonrheumatic mitral (valve) prolapse: Secondary | ICD-10-CM

## 2023-07-16 DIAGNOSIS — I340 Nonrheumatic mitral (valve) insufficiency: Secondary | ICD-10-CM

## 2023-07-18 DIAGNOSIS — J029 Acute pharyngitis, unspecified: Secondary | ICD-10-CM | POA: Diagnosis not present

## 2023-08-01 ENCOUNTER — Telehealth: Payer: Self-pay | Admitting: Internal Medicine

## 2023-08-01 NOTE — Telephone Encounter (Signed)
 Spoke with pt regarding a referral to [facility]. Pt was notified that her PCP was the one that placed the referral to a cardiothoracic surgeon at [facility] and that she should follow up with her. Pt verbalized understanding. All questions if any were answered.

## 2023-08-01 NOTE — Telephone Encounter (Signed)
 Pt is requesting a callback regarding the referral Dr. Mona sent for her to [facility]. She'd like to discuss further with a nurse. Please advise.

## 2023-08-02 ENCOUNTER — Telehealth: Payer: Self-pay

## 2023-08-02 NOTE — Telephone Encounter (Signed)
 Spoke with pt, pt is aware and expressed understanding. Pt is aware cardiology closed referral placed by PCP.

## 2023-08-02 NOTE — Telephone Encounter (Signed)
 Copied from CRM [id number]. Topic: Referral - Question >> Aug 02, 2023 12:36 PM Lavanda D wrote: Reason for CRM: Patient would like further communication regarding the change of referral from Laura's referral to Dr. Katharina referral. She would like to know if her preference for less invasive surgery methods were taken into consideration. She would like a call back to clarify the referral details.

## 2023-08-12 DIAGNOSIS — I340 Nonrheumatic mitral (valve) insufficiency: Secondary | ICD-10-CM | POA: Diagnosis not present

## 2023-08-19 ENCOUNTER — Other Ambulatory Visit: Payer: Self-pay

## 2023-08-19 ENCOUNTER — Ambulatory Visit
Admission: RE | Admit: 2023-08-19 | Discharge: 2023-08-19 | Disposition: A | Discharge status: Home / Self Care | Attending: Family Medicine | Admitting: Family Medicine

## 2023-08-19 VITALS — BP 126/73 | HR 89 | Temp 98.4°F | Resp 16

## 2023-08-19 DIAGNOSIS — L02412 Cutaneous abscess of left axilla: Secondary | ICD-10-CM | POA: Diagnosis not present

## 2023-08-19 MED ORDER — DOXYCYCLINE HYCLATE 100 MG PO CAPS: 100.0000 mg | ORAL_CAPSULE | Freq: Two times a day (BID) | ORAL | 0 refills | Status: DC

## 2023-08-19 MED ORDER — FLUCONAZOLE 150 MG PO TABS: 150.0000 mg | ORAL_TABLET | Freq: Every day | ORAL | 0 refills | Status: DC

## 2023-08-19 NOTE — ED Triage Notes (Signed)
 Abscess to left axilla, bad since Friday tried to drain it herself. Reports thinks 2 more abscesses forming to left axilla now. No fever. Advil  prn.

## 2023-08-19 NOTE — ED Provider Notes (Signed)
 [facility]    CSN: [id number] Arrival date & time: 08/19/23  1001      History   Chief Complaint Chief Complaint  Patient presents with   Abscess    HPI Alicia Kelley is a 33 y.o. female.   HPI  Patient has an abscess under her left arm.  It has been present for several days.  She states that it has been swollen and painful.  It has been draining since yesterday.  She states that she is here because she has noticed 2 additional lumps under the same arm that are forming.  Concern for multiple abscesses.  She has not had this problem before.  Past Medical History:  Diagnosis Date   At high risk for breast cancer 12/10/2022   Lifetime risk (based on Beatrice Harvey) is 23.4% (calculated in November 2024) Recommended annual mammograms and consideration for annual breast MRIs    H/O mitral valve prolapse    moderate regurgitation, mild left atrial enlargement   Mitral valve prolapse     Patient Active Problem List   Diagnosis Date Noted   Family history of breast cancer 12/10/2022   At high risk for breast cancer 12/10/2022   SVD (spontaneous vaginal delivery) 07/03/2020   Normal postpartum course 07/03/2020   Normal labor 07/02/2020   MVP (mitral valve prolapse) 02/01/2020   History of PAT (paroxysmal atrial tachycardia) 03/09/2015   Severe mitral regurgitation 12/22/2014   Migraine 12/22/2014    Past Surgical History:  Procedure Laterality Date   NO PAST SURGERIES     TEE WITHOUT CARDIOVERSION N/A 06/15/2022   Procedure: TRANSESOPHAGEAL ECHOCARDIOGRAM;  Surgeon: Lonni Slain, MD;  Location: [facility] INVASIVE CV LAB;  Service: Cardiovascular;  Laterality: N/A;    OB History     Gravida  1   Para  1   Term  1   Preterm      AB      Living  1      SAB      IAB      Ectopic      Multiple  0   Live Births  1            Home Medications    Prior to Admission medications   Medication Sig Start Date End Date Taking? Authorizing  Provider  doxycycline  (VIBRAMYCIN ) 100 MG capsule Take 1 capsule (100 mg total) by mouth 2 (two) times daily. 08/19/23  Yes Maranda Jamee Jacob, MD  fluconazole  (DIFLUCAN ) 150 MG tablet Take 1 tablet (150 mg total) by mouth daily. Repeat in 1 week if needed 08/19/23  Yes Maranda Jamee Jacob, MD  albuterol  (VENTOLIN  HFA) 108 [phone] Base) MCG/ACT inhaler Inhale 1-2 puffs into the lungs every 6 (six) hours as needed for wheezing or shortness of breath. 04/12/23   Jason Leita Repine, FNP  cetirizine (ZYRTEC) 10 MG tablet Take 10 mg by mouth daily as needed for allergies.    [provider]  desonide (DESOWEN) 0.05 % ointment Apply topically. 04/03/23   [provider]  fluticasone  (FLONASE ) 50 MCG/ACT nasal spray Place 2 sprays into both nostrils daily. 12/13/22   Jason Leita Repine, FNP  ibuprofen  (ADVIL ) 200 MG tablet Take 400 mg by mouth every 6 (six) hours as needed for moderate pain.    [provider]  norethindrone (MICRONOR) 0.35 MG tablet Take 1 tablet by mouth daily. 09/28/21   [provider]    Family History Family History  Problem Relation Age of Onset  Hypertension Mother    Healthy Father    Heart murmur Sister    Healthy Brother    Breast cancer Paternal Aunt        dx <50   Prostate cancer Maternal Grandfather        dx 79s; no mets   Breast cancer Paternal Grandmother 67   Colon cancer Paternal Grandfather        dx <50   Prostate cancer Paternal Grandfather     Social History Social History   Tobacco Use   Smoking status: Never   Smokeless tobacco: Never  Vaping Use   Vaping status: Never Used  Substance Use Topics   Alcohol use: Never   Drug use: Never     Allergies   Ethylenediamine dihydrochloride [ethylenediamine], Magnesium, Nickel, and Lidocaine    Review of Systems Review of Systems See HPI  Physical Exam Triage Vital Signs ED Triage Vitals  Encounter Vitals Group     BP 08/19/23 1002 126/73     Girls  Systolic BP Percentile --      Girls Diastolic BP Percentile --      Boys Systolic BP Percentile --      Boys Diastolic BP Percentile --      Pulse Rate 08/19/23 1002 89     Resp 08/19/23 1002 16     Temp 08/19/23 1002 98.4 F (36.9 C)     Temp src --      SpO2 08/19/23 1002 100 %     Weight --      Height --      Head Circumference --      Peak Flow --      Pain Score 08/19/23 1008 3     Pain Loc --      Pain Education --      Exclude from Growth Chart --    No data found.  Updated Vital Signs BP 126/73   Pulse 89   Temp 98.4 F (36.9 C)   Resp 16   LMP 08/12/2023 (Approximate)   SpO2 100%      Physical Exam Constitutional:      General: She is not in acute distress.    Appearance: She is well-developed.  HENT:     Head: Normocephalic and atraumatic.  Eyes:     Conjunctiva/sclera: Conjunctivae normal.     Pupils: Pupils are equal, round, and reactive to light.  Cardiovascular:     Rate and Rhythm: Normal rate.  Pulmonary:     Effort: Pulmonary effort is normal. No respiratory distress.  Abdominal:     General: There is no distension.     Palpations: Abdomen is soft.  Musculoskeletal:        General: Normal range of motion.     Cervical back: Normal range of motion.  Skin:    General: Skin is warm and dry.     Comments: Left axilla is examined.  There are 2 tender nodules that are 1-1/2 cm across in the center of the axilla.  Towards the left inner arm there is a open abscess with 2 cm of induration.  There is a 1 cm opening that is a good 1 cm deep.  No fluctuance  Neurological:     Mental Status: She is alert.      UC Treatments / Results  Labs (all labs ordered are listed, but only abnormal results are displayed) Labs Reviewed - No data to display  EKG   Radiology No results found.  Procedures Procedures (including critical care time)  Medications Ordered in UC Medications - No data to display  Initial Impression / Assessment and Plan /  UC Course  I have reviewed the triage vital signs and the nursing notes.  Pertinent labs & imaging results that were available during my care of the patient were reviewed by me and considered in my medical decision making (see chart for details).     I explained that the abscess that had ruptured does not require additional I&D.  Will treat with antibiotics and warm compresses.  Follow-up with PCP Final Clinical Impressions(s) / UC Diagnoses   Final diagnoses:  Abscess of left axilla     Discharge Instructions      Take the doxycycline  antibiotic 2 times a day Make sure you take this with food Take 2 doses today  I have prescribed Diflucan  in case it is needed Follow-up with your primary care doctor   ED Prescriptions     Medication Sig Dispense Auth. Provider   doxycycline  (VIBRAMYCIN ) 100 MG capsule Take 1 capsule (100 mg total) by mouth 2 (two) times daily. 20 capsule Maranda Jamee Jacob, MD   fluconazole  (DIFLUCAN ) 150 MG tablet Take 1 tablet (150 mg total) by mouth daily. Repeat in 1 week if needed 2 tablet Maranda Jamee Jacob, MD      PDMP not reviewed this encounter.   Maranda Jamee Jacob, MD 08/19/23 [phone]

## 2023-08-19 NOTE — Discharge Instructions (Signed)
 Take the doxycycline  antibiotic 2 times a day Make sure you take this with food Take 2 doses today  I have prescribed Diflucan  in case it is needed Follow-up with your primary care doctor

## 2023-08-23 ENCOUNTER — Ambulatory Visit: Admitting: Family

## 2023-08-29 ENCOUNTER — Ambulatory Visit: Admitting: Internal Medicine

## 2023-08-29 ENCOUNTER — Other Ambulatory Visit: Payer: Self-pay

## 2023-08-29 ENCOUNTER — Telehealth: Payer: Self-pay

## 2023-08-29 ENCOUNTER — Telehealth: Payer: Self-pay | Admitting: Internal Medicine

## 2023-08-29 ENCOUNTER — Encounter: Payer: Self-pay | Admitting: Internal Medicine

## 2023-08-29 VITALS — BP 122/72 | HR 83 | Temp 98.1°F | Resp 18 | Ht 68.0 in | Wt 128.2 lb

## 2023-08-29 DIAGNOSIS — L301 Dyshidrosis [pompholyx]: Secondary | ICD-10-CM | POA: Insufficient documentation

## 2023-08-29 DIAGNOSIS — L253 Unspecified contact dermatitis due to other chemical products: Secondary | ICD-10-CM | POA: Diagnosis not present

## 2023-08-29 DIAGNOSIS — J4520 Mild intermittent asthma, uncomplicated: Secondary | ICD-10-CM | POA: Diagnosis not present

## 2023-08-29 DIAGNOSIS — I341 Nonrheumatic mitral (valve) prolapse: Secondary | ICD-10-CM | POA: Diagnosis not present

## 2023-08-29 MED ORDER — OPZELURA 1.5 % EX CREA: 1.0000 | TOPICAL_CREAM | Freq: Two times a day (BID) | CUTANEOUS | 5 refills | Status: DC | PRN

## 2023-08-29 MED ORDER — ALBUTEROL SULFATE HFA 108 (90 BASE) MCG/ACT IN AERS: 2.0000 | INHALATION_SPRAY | RESPIRATORY_TRACT | 1 refills | Status: AC | PRN

## 2023-08-29 MED ORDER — FLUOCINOLONE ACETONIDE SCALP 0.01 % EX OIL: 1.0000 | TOPICAL_OIL | Freq: Two times a day (BID) | CUTANEOUS | 5 refills | Status: AC | PRN

## 2023-08-29 NOTE — Telephone Encounter (Signed)
*  AA  Pharmacy Patient Advocate Encounter   Received notification from CoverMyMeds that prior authorization for Opzelura  1.5% cream  is required/requested.   Insurance verification completed.   The patient is insured through [facility] .   Per test claim: PA required; PA submitted to above mentioned insurance via CoverMyMeds Key/confirmation #/EOC [id number] Status is pending

## 2023-08-29 NOTE — Progress Notes (Addendum)
 "  NEW PATIENT Date of Service/Encounter:   08/29/2023 Referring provider: Jason Leita Repine,* Primary care provider: Jason Leita Repine, FNP  Subjective:  Steffanie Mingle is a 33 y.o. female with a PMHx of severe mitral valve prolapse presenting today for evaluation of hand dermatitis and intermittent asthma. History obtained from: chart review and patient.   Discussed the use of AI scribe software for clinical note transcription with the patient, who gave verbal consent to proceed.  History of Present Illness Ethelle Ola is a 33 year old female who presents with hand and arm dermatitis.  Hand and arm dermatitis - Pruritic rash on hands and arms, currently less severe than usual - Skin appears wrinkly and older in affected areas - Rash remains localized for weeks at a time - Blisters with clear fluid present on sides of fingers - Rash on arms began approximately three weeks ago after insect bites sustained while gardening - History of rashes triggered by heat and sensitivity to perfumes and fragrances - Requires use of specific soaps and paints due to sensitivities - Improvement noted with use of topical steroid creams (clobetasol, desonide) and an oil similar to one used on scalp  Contact allergies - Patch testing revealed allergies to nickel and ethylenediamine-avoiding contact with these items - No known medication or food allergies outside of those listed in chart (magnesium, nickel, lidocaine , ethylenediamine)  Orolabial symptoms - Episodes of lip swelling and cracking and angular cheilitis occurred twice this year, most recently in June or early July -Both times pineapple was consumed - Both times symptoms lasted for several days prior to resolving - No recurrence since eliminating pineapple from diet, suspected trigger  Respiratory symptoms - Prescribed inhaler for respiratory symptoms associated with upper respiratory infections - Uses inhaler for several days  during illness - No history of hospitalization for respiratory issues -No history of requiring systemic steroids during illnesses  Cardiac history - Mitral valve prolapse with plans for surgical intervention - Recent evaluation by a cardiac surgeon   Past Medical History: Past Medical History:  Diagnosis Date   At high risk for breast cancer 12/10/2022   Lifetime risk (based on Beatrice Harvey) is 23.4% (calculated in November 2024) Recommended annual mammograms and consideration for annual breast MRIs    Eczema    H/O mitral valve prolapse    moderate regurgitation, mild left atrial enlargement   Mitral valve prolapse    Urticaria    Medication List:  Current Outpatient Medications  Medication Sig Dispense Refill   cetirizine (ZYRTEC) 10 MG tablet Take 10 mg by mouth daily as needed for allergies.     clobetasol ointment (TEMOVATE) 0.05 % Apply 1 Application topically.     desonide (DESOWEN) 0.05 % ointment Apply topically.     doxycycline  (VIBRAMYCIN ) 100 MG capsule Take 1 capsule (100 mg total) by mouth 2 (two) times daily. 20 capsule 0   fluconazole  (DIFLUCAN ) 150 MG tablet Take 1 tablet (150 mg total) by mouth daily. Repeat in 1 week if needed 2 tablet 0   fluticasone  (FLONASE ) 50 MCG/ACT nasal spray Place 2 sprays into both nostrils daily. 16 g 6   ibuprofen  (ADVIL ) 200 MG tablet Take 400 mg by mouth every 6 (six) hours as needed for moderate pain.     norethindrone  (MICRONOR ) 0.35 MG tablet Take 1 tablet by mouth daily.     Ruxolitinib Phosphate  (OPZELURA ) 1.5 % CREA Apply 1 Application topically 2 (two) times daily as needed. 60 g 5   albuterol  (  VENTOLIN  HFA) 108 (90 Base) MCG/ACT inhaler Inhale 2 puffs into the lungs every 4 (four) hours as needed for wheezing or shortness of breath. 6.7 g 1   Fluocinolone  Acetonide Scalp 0.01 % OIL Apply 1 Application topically 2 (two) times daily as needed. 118 mL 5   No current facility-administered medications for this visit.   Known  Allergies:  Allergies  Allergen Reactions   Ethylenediamine Dihydrochloride [Ethylenediamine] Rash    Causes irritation    Magnesium Palpitations   Nickel Rash   Lidocaine  Swelling    Pt. Only reacts to the topical lidocaine     Lidocaine -Menthol  Swelling   Past Surgical History: Past Surgical History:  Procedure Laterality Date   NO PAST SURGERIES     TEE WITHOUT CARDIOVERSION N/A 06/15/2022   Procedure: TRANSESOPHAGEAL ECHOCARDIOGRAM;  Surgeon: Lonni Slain, MD;  Location: [facility] INVASIVE CV LAB;  Service: Cardiovascular;  Laterality: N/A;   Family History: Family History  Problem Relation Age of Onset   Allergic rhinitis Mother    Hypertension Mother    Healthy Father    Heart murmur Sister    Allergic rhinitis Brother    Urticaria Maternal Aunt    Allergic rhinitis Maternal Aunt    Breast cancer Paternal Aunt        dx <50   Prostate cancer Maternal Grandfather        dx 69s; no mets   Breast cancer Paternal Grandmother 38   Colon cancer Paternal Grandfather        dx <50   Prostate cancer Paternal Grandfather    Atopy Neg Hx    Social History: Seng lives in a house built 3 years ago, no water damage, carpet in the bedroom, gassing, central AC, no pets, no roaches, using dust mite covers in the bed but not pillows, no smoke exposure.  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  [place]  Is exposed to fumes chemicals or dust with her hobbies use.  Non-smoker.   ROS:  All other systems negative except as noted per HPI.  Objective:  Blood pressure 122/72, pulse 83, temperature 98.1 F (36.7 C), temperature source Temporal, resp. rate 18, height 5' 8 (1.727 m), weight 128 lb 3.2 oz (58.2 kg), last menstrual period 08/12/2023, SpO2 99%. Body mass index is 19.49 kg/m. Physical Exam:  General Appearance:  Alert, cooperative, no distress, appears stated age  Head:  Normocephalic, without obvious abnormality, atraumatic   Eyes:  Conjunctiva clear, EOM's intact  Ears EACs normal bilaterally and normal TMs bilaterally  Nose: Nares normal, hypertrophic turbinates, normal mucosa, and no visible anterior polyps  Throat: Lips, tongue normal; teeth and gums normal, normal posterior oropharynx  Neck: Supple, symmetrical  Lungs:   clear to auscultation bilaterally, Respirations unlabored, no coughing  Heart:  IV/VI systolic murmur that radiates to scapula and regular rate and rhythm, Appears well perfused  Extremities: No edema  Skin: erythematous, dry patches scattered on bilateral hands and lichenification on bilateral hands, hyperpigmented round macules on right ventral wrist  Neurologic: No gross deficits   Diagnostics: Spirometry:  Tracings reviewed. Her effort: Good reproducible efforts. FVC: 3.82L  FEV1: 3.15L, 100% predicted  FEV1/FVC ratio: 0.82  Interpretation: Spirometry consistent with normal pattern.  Please see scanned spirometry results for details.  Labs:  Lab Orders  No laboratory test(s) ordered today     Assessment and Plan  Assessment and Plan Assessment & Plan Hand dermatitis (dyshidrotic eczema) Chronic dyshidrotic eczema  primarily affects hands, recently extending to arms, with blisters and wrinkly skin. Likely exacerbated by irritants and environmental factors, not food allergies. Previous patch testing positive for nickel and ethylenediamine. Current treatment with topical steroids.  - Obtain dermatology records for previous patch testing. - avoid allergens from previous patch testing - Prescribe Opzelura  (topical JAK inhibitor) as a non-steroid option. Use twice daily as needed in place of steroid options - for severe flares: use clobetasol 0.05% twice daily as needed during active flares - for mild flares: use desonide ointment twice daily as needed during active flares - alternatively can use fluocinolone  oil twice daily as needed during active flares - Schedule environmental  allergy  testing for September 11, 2023, at 11:00 AM (1-68) - Instruct to avoid antihistamines for three days prior to allergy  testing. - Discuss potential use of Dupixent or Nemluvio post-surgery if symptoms persist.  Contact dermatitis of lips (likely irritant/food-related) Episodes of lip swelling and cracking likely due to contact dermatitis from pineapple consumption. Symptoms resolved after avoiding pineapple. Acidic fruits can cause contact dermatitis due to acidity and specific chemicals. Reactions do not require an EpiPen. - Advise to avoid pineapple and other acidic fruits if bothersome to prevent recurrence.  Mitral valve prolapse with severe regurgitation Mitral valve prolapse with severe regurgitation leading to left atrial enlargement. Symptoms include shortness of breath, exacerbated by respiratory infections. Oral JAK inhibitors not recommended due to upcoming surgery.  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 These allergies contribute to contact dermatitis. - Ensure avoidance of nickel, lidocaine , and ethylenediamine in products and medications.  Shortness of breath  Responsive to albuterol , used during illness only Spirometry obtained today and normal Monitor frequency of use of albuterol  Use 2 puffs every 4 hours as needed for wheezing or shortness of breath   Follow up : August 20th at 11 AM (1-68) must be off all antihistamines 3 days prior to visit. It was a pleasure meeting you in clinic today! Thank you for allowing me to participate in your care.  Rocky Endow, MD [facility]    This note in its entirety was forwarded to the Provider who requested this consultation.  Other: none  Thank you for your kind referral. I appreciate the opportunity to take part in Marilou's care. Please do not hesitate to contact me with  questions.  Sincerely,  Rocky Endow, MD [facility] [place]    Addendum-received notes from dermatology office.  Prescribed desonide and tacrolimus for use of contact dermatitis on lips.  Final patch read on 04/12/2023-positive for nickel and ethylenediamine dihydrochloride.  Patches were performed using true test.  Patient was also prescribed clobetasol for dyshidrotic eczema.  She was given Derma-Smoothe  for seborrheic dermatitis   "

## 2023-08-29 NOTE — Telephone Encounter (Signed)
 Faxed release of records to [facility]. Per DO Dennis. [phone]

## 2023-08-29 NOTE — Patient Instructions (Addendum)
 Hand dermatitis (dyshidrotic eczema) Chronic dyshidrotic eczema primarily affects hands, recently extending to arms, with blisters and wrinkly skin. Likely exacerbated by irritants and environmental factors, not food allergies. Previous patch testing positive for nickel and ethylenediamine. Current treatment with topical steroids.  - Obtain dermatology records for previous patch testing. - avoid allergens from previous patch testing - Prescribe Opzelura  (topical JAK inhibitor) as a non-steroid option. Use twice daily as needed in place of steroid options - for severe flares: use clobetasol 0.05% twice daily as needed during active flares - for mild flares: use desonide ointment twice daily as needed during active flares - alternatively can use fluocinolone  oil twice daily as needed during active flares - Schedule environmental allergy testing for September 11, 2023, at 11:00 AM (1-68) - Instruct to avoid antihistamines for three days prior to allergy testing. - Discuss potential use of Dupixent or Nemluvio post-surgery if symptoms persist.  Contact dermatitis of lips (likely irritant/food-related) Episodes of lip swelling and cracking likely due to contact dermatitis from pineapple consumption. Symptoms resolved after avoiding pineapple. Acidic fruits can cause contact dermatitis due to acidity and specific chemicals. Reactions do not require an EpiPen. - Advise to avoid pineapple and other acidic fruits if bothersome to prevent recurrence.  Mitral valve prolapse with severe regurgitation Mitral valve prolapse with severe regurgitation leading to left atrial enlargement. Symptoms include shortness of breath, exacerbated by respiratory infections. Oral JAK inhibitors not recommended due to upcoming surgery.  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 These allergies contribute to contact  dermatitis. - Ensure avoidance of nickel, lidocaine , and ethylenediamine in products and medications.  Shortness of breath  Responsive to albuterol , used during illness only Spirometry obtained today and normal Monitor frequency of use of albuterol  Use 2 puffs every 4 hours as needed for wheezing or shortness of breath   Follow up : August 20th at 11 AM (1-68) must be off all antihistamines 3 days prior to visit. It was a pleasure meeting you in clinic today! Thank you for allowing me to participate in your care.  Rocky Endow, MD [facility] [place]    This note in its entirety was forwarded to the Provider who requested this consultation.  Other: none  Thank you for your kind referral. I appreciate the opportunity to take part in Alicia Kelley's care. Please do not hesitate to contact me with questions.  Sincerely,  Rocky Endow, MD [facility] [place] 

## 2023-08-30 NOTE — Telephone Encounter (Signed)
 Approved on August 7 by [facility] [employer] [facility] 2017 Approved. Effective Date: 08/29/2023 Authorization Expiration Date: 08/28/2024

## 2023-09-11 ENCOUNTER — Ambulatory Visit (INDEPENDENT_AMBULATORY_CARE_PROVIDER_SITE_OTHER): Admitting: Internal Medicine

## 2023-09-11 ENCOUNTER — Encounter: Payer: Self-pay | Admitting: Internal Medicine

## 2023-09-11 DIAGNOSIS — T783XXD Angioneurotic edema, subsequent encounter: Secondary | ICD-10-CM | POA: Diagnosis not present

## 2023-09-11 DIAGNOSIS — L308 Other specified dermatitis: Secondary | ICD-10-CM

## 2023-09-11 MED ORDER — OPZELURA 1.5 % EX CREA: 1.0000 | TOPICAL_CREAM | Freq: Two times a day (BID) | CUTANEOUS | 5 refills | Status: DC | PRN

## 2023-09-11 NOTE — Progress Notes (Signed)
 Date of Service/Encounter:  09/11/23  Allergy  testing appointment   Initial visit on 08/29/23, seen for hand dermatitis, angioedema lip-suspected contact dermatitis.  Please see that note for additional details.  Today reports for allergy  diagnostic testing:    DIAGNOSTICS:  Skin Testing: Environmental allergy  panel and select foods. Adequate positive and negative controls. Results discussed with patient/family.  Airborne Adult Perc - 09/11/23 1104     Time Antigen Placed 1100    Allergen Manufacturer Jestine    Location Back    Number of Test 55    1. Control-Buffer 50% Glycerol Negative    2. Control-Histamine 3+    3. Bahia 3+    4. [place] 3+    5. Johnson 3+    6. [place]  Blue 4+    7. Meadow Fescue 4+    8. Perennial Rye 4+    9. Timothy 4+    10. Ragweed Mix Negative    11. Cocklebur Negative    12. Plantain,  English Negative    13. Baccharis Negative    14. Dog Fennel Negative    15. Russian Thistle Negative    16. Lamb's Quarters 3+    17. Sheep Sorrell Negative    18. Rough Pigweed Negative    19. Marsh Elder, Rough Negative    20. Mugwort, Common Negative    21. Box, Elder Negative    22. Cedar, red Negative    23. Sweet Gum Negative    24. Pecan Pollen 3+    25. Pine Mix Negative    26. Walnut, Black Pollen Negative    27. Red Mulberry Negative    28. Ash Mix Negative    29. Birch Mix Negative    30. Beech American Negative    31. Cottonwood, Guinea-Bissau Negative    32. Hickory, White Negative    33. Maple Mix 3+    34. Oak, Guinea-[place]issau Mix Negative    35. Sycamore Eastern Negative    36. Alternaria Alternata 3+    37. Cladosporium Herbarum Negative    38. Aspergillus Mix Negative    39. Penicillium Mix Negative    40. Bipolaris Sorokiniana (Helminthosporium) 2+    41. Drechslera Spicifera (Curvularia) Negative    42. Mucor Plumbeus Negative    43. Fusarium Moniliforme Negative    44. Aureobasidium Pullulans (pullulara) Negative    45. Rhizopus  Oryzae Negative    46. Botrytis Cinera Negative    47. Epicoccum Nigrum Negative    48. Phoma Betae Negative    49. Dust Mite Mix 2+    50. Cat Hair 10,000 BAU/ml 4+    51.  Dog Epithelia Negative    52. Mixed Feathers Negative    53. Horse Epithelia Negative    54. Cockroach, German Negative    55. Tobacco Leaf Negative          13 Food Perc - 09/11/23 1152       Test Information   Time Antigen Placed 1130    Allergen Manufacturer Jestine    Location Back    Number of allergen test 13      Food   1. Peanut Negative    2. Soybean Negative    3. Wheat Negative    4. Sesame Negative    5. Milk, Cow Negative    6. Casein Negative    7. Egg White, Chicken Negative    8. Shellfish Mix Negative    9. Fish Mix Negative    10.  Cashew Negative    11. Walnut Food Negative    12. Almond Negative    13. Hazelnut Negative          Intradermal - 09/11/23 1217     Time Antigen Placed 1145    Allergen Manufacturer Jestine    Location Back    Number of Test 6    Control 3+    Ragweed Mix 3+    Mold 2 3+    Mold 4 3+    Dog 4+    Cockroach Negative          Allergy  testing results were read and interpreted by myself, documented by clinical staff.  Patient provided with copy of allergy  testing along with avoidance measures when indicated.   Rocky Endow, MD  [facility] [place] [facility]    -------------------------------------------------------------------------- Assessment and plan , Hand dermatitis (dyshidrotic eczema) Chronic dyshidrotic eczema primarily affects hands, recently extending to arms, with blisters and wrinkly skin. Likely exacerbated by irritants and environmental factors, not food allergies. Previous patch testing positive for nickel and ethylenediamine. Current treatment with topical steroids.  - Obtain dermatology records for previous patch testing. - avoid allergens from previous patch testing - Prescribe Opzelura  (topical JAK  inhibitor) as a non-steroid option. Use twice daily as needed in place of steroid options-will send to [facility] send out pharmacy in [place] - for severe flares: use clobetasol 0.05% twice daily as needed during active flares - for mild flares: use desonide ointment twice daily as needed during active flares - alternatively can use fluocinolone  oil twice daily as needed during active flares - allergy  testing 09/11/23 positive to grass, weed and tree pollen, outdoor mold, dust mite and cat; IDs positive to ragweed pollen, dog hair and indoor molds; allergen avoidance. -Consider allergy  injections to reduce lifetime symptoms and need for medications by teaching your immune system to become tolerant of the environmental allergens you are allergic to-this may help some with hand dermatitis, but would not expect it to completely heal this. - Discuss potential use of Dupixent or Nemluvio post-surgery if symptoms persist.  Contact dermatitis of lips (likely irritant/food-related) Episodes of lip swelling and cracking likely due to contact dermatitis from pineapple consumption. Symptoms resolved after avoiding pineapple. Acidic fruits can cause contact dermatitis due to acidity and specific chemicals. Reactions do not require an EpiPen. - Advise to avoid pineapple and other acidic fruits if bothersome to prevent recurrence. - most common food allergens negative on skin testing 09/11/23 - blood work for pineapple allergy   Mitral valve prolapse with severe regurgitation Mitral valve prolapse with severe regurgitation leading to left atrial enlargement. Symptoms include shortness of breath, exacerbated by respiratory infections. Oral JAK inhibitors not recommended due to upcoming surgery.  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 These allergies contribute to contact dermatitis. - Ensure avoidance of  nickel, lidocaine , and ethylenediamine in products and medications.  Shortness of breath  Responsive to albuterol , used during illness only Spirometry obtained today and normal Monitor frequency of use of albuterol  Use 2 puffs every 4 hours as needed for wheezing or shortness of breath   Follow up : 3 months, sooner if needed.  It was a pleasure meeting you in clinic today! Thank you for allowing me to participate in your care.

## 2023-09-11 NOTE — Patient Instructions (Addendum)
,   Hand dermatitis (dyshidrotic eczema) Chronic dyshidrotic eczema primarily affects hands, recently extending to arms, with blisters and wrinkly skin. Likely exacerbated by irritants and environmental factors, not food allergies. Previous patch testing positive for nickel and ethylenediamine. Current treatment with topical steroids.  - Obtain dermatology records for previous patch testing. - avoid allergens from previous patch testing - Prescribe Opzelura  (topical JAK inhibitor) as a non-steroid option. Use twice daily as needed in place of steroid options-will send to [facility] send out pharmacy in [place] - for severe flares: use clobetasol 0.05% twice daily as needed during active flares - for mild flares: use desonide ointment twice daily as needed during active flares - alternatively can use fluocinolone  oil twice daily as needed during active flares - allergy  testing 09/11/23 positive to grass, weed and tree pollen, outdoor mold, dust mite and cat; IDs positive to ragweed pollen, dog hair and indoor molds; allergen avoidance. -Consider allergy  injections to reduce lifetime symptoms and need for medications by teaching your immune system to become tolerant of the environmental allergens you are allergic to-this may help some with hand dermatitis, but would not expect it to completely heal this. - Discuss potential use of Dupixent or Nemluvio post-surgery if symptoms persist.  Contact dermatitis of lips (likely irritant/food-related) Episodes of lip swelling and cracking likely due to contact dermatitis from pineapple consumption. Symptoms resolved after avoiding pineapple. Acidic fruits can cause contact dermatitis due to acidity and specific chemicals. Reactions do not require an EpiPen. - Advise to avoid pineapple and other acidic fruits if bothersome to prevent recurrence. - most common food allergens negative on skin testing 09/11/23 - blood work for pineapple allergy   Mitral valve  prolapse with severe regurgitation Mitral valve prolapse with severe regurgitation leading to left atrial enlargement. Symptoms include shortness of breath, exacerbated by respiratory infections. Oral JAK inhibitors not recommended due to upcoming surgery.  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 These allergies contribute to contact dermatitis. - Ensure avoidance of nickel, lidocaine , and ethylenediamine in products and medications.  Shortness of breath  Responsive to albuterol , used during illness only Spirometry obtained today and normal Monitor frequency of use of albuterol  Use 2 puffs every 4 hours as needed for wheezing or shortness of breath   Follow up : 3 months, sooner if needed.  It was a pleasure meeting you in clinic today! Thank you for allowing me to participate in your care.  Rocky Endow, MD [facility] [place]    This note in its entirety was forwarded to the Provider who requested this consultation.  Other: none  Thank you for your kind referral. I appreciate the opportunity to take part in Alicia Kelley's care. Please do not hesitate to contact me with questions.  Sincerely,  Rocky Endow, MD [facility] [place]

## 2023-09-12 DIAGNOSIS — T783XXD Angioneurotic edema, subsequent encounter: Secondary | ICD-10-CM | POA: Diagnosis not present

## 2023-09-12 DIAGNOSIS — L308 Other specified dermatitis: Secondary | ICD-10-CM | POA: Diagnosis not present

## 2023-09-16 ENCOUNTER — Ambulatory Visit: Payer: Self-pay | Admitting: Internal Medicine

## 2023-09-16 LAB — ALLERGEN, PINEAPPLE, F210: Pineapple IgE: 0.17 kU/L — AB

## 2023-09-17 NOTE — Progress Notes (Signed)
 My chart message sent  Good morning,   Your pineapple lab returned and is below our typical positive threshold of 0.35. I suspect you had a contact reaction to the pineapple based on your symptoms. I would still recommend avoidance of pineapple, but do not think you will need to carry an epinephrine autoinjector.   Let me know if you have any questions or concerns.  Dr Marinda

## 2023-09-25 DIAGNOSIS — N390 Urinary tract infection, site not specified: Secondary | ICD-10-CM | POA: Diagnosis not present

## 2023-10-02 ENCOUNTER — Encounter: Payer: Self-pay | Admitting: Internal Medicine

## 2023-10-04 ENCOUNTER — Telehealth: Payer: Self-pay | Admitting: Internal Medicine

## 2023-10-04 NOTE — Telephone Encounter (Signed)
 Pt called in to inform Dr. Mona she is scheduled for surgery with Dr. Ricky at [facility] 11/18/23. She has f/u here  with Dr. Mona 12/04/23. She asked if this is okay time for f/u or should she push out a bit more. Please advise. Dr. Reyes notes are available in Epic.

## 2023-10-04 NOTE — Telephone Encounter (Signed)
 Spoke with patient, informed her Dr. Mona and his nurse are not in the office today. Message will be forwarded to them to review when they return, and someone will follow-up with her early next week.  Patient verbalized understanding and expressed appreciation for call.

## 2023-10-07 NOTE — Telephone Encounter (Signed)
 Message sent to patient via MyChart.

## 2023-10-07 NOTE — Telephone Encounter (Signed)
 Mona Vinie BROCKS, MD to Vicci Roxie CROME, RN   10/04/23  4:16 PM That follow-up date after surgery should be fine - I will review her records from [facility]   Dr VEAR

## 2023-10-21 DIAGNOSIS — Z01419 Encounter for gynecological examination (general) (routine) without abnormal findings: Secondary | ICD-10-CM | POA: Diagnosis not present

## 2023-10-21 DIAGNOSIS — N898 Other specified noninflammatory disorders of vagina: Secondary | ICD-10-CM | POA: Diagnosis not present

## 2023-10-23 DIAGNOSIS — L732 Hidradenitis suppurativa: Secondary | ICD-10-CM | POA: Diagnosis not present

## 2023-11-17 DIAGNOSIS — R918 Other nonspecific abnormal finding of lung field: Secondary | ICD-10-CM | POA: Diagnosis not present

## 2023-11-17 DIAGNOSIS — R931 Abnormal findings on diagnostic imaging of heart and coronary circulation: Secondary | ICD-10-CM | POA: Diagnosis not present

## 2023-11-17 DIAGNOSIS — J948 Other specified pleural conditions: Secondary | ICD-10-CM | POA: Diagnosis not present

## 2023-11-17 DIAGNOSIS — G8918 Other acute postprocedural pain: Secondary | ICD-10-CM | POA: Diagnosis not present

## 2023-11-17 DIAGNOSIS — Z4682 Encounter for fitting and adjustment of non-vascular catheter: Secondary | ICD-10-CM | POA: Diagnosis not present

## 2023-11-17 DIAGNOSIS — I471 Supraventricular tachycardia, unspecified: Secondary | ICD-10-CM | POA: Diagnosis not present

## 2023-11-17 DIAGNOSIS — R5381 Other malaise: Secondary | ICD-10-CM | POA: Diagnosis not present

## 2023-11-17 DIAGNOSIS — J90 Pleural effusion, not elsewhere classified: Secondary | ICD-10-CM | POA: Diagnosis not present

## 2023-11-17 DIAGNOSIS — I340 Nonrheumatic mitral (valve) insufficiency: Secondary | ICD-10-CM | POA: Diagnosis not present

## 2023-11-17 DIAGNOSIS — I341 Nonrheumatic mitral (valve) prolapse: Secondary | ICD-10-CM | POA: Diagnosis not present

## 2023-11-17 DIAGNOSIS — I9581 Postprocedural hypotension: Secondary | ICD-10-CM | POA: Diagnosis not present

## 2023-11-17 DIAGNOSIS — I511 Rupture of chordae tendineae, not elsewhere classified: Secondary | ICD-10-CM | POA: Diagnosis not present

## 2023-11-17 DIAGNOSIS — J939 Pneumothorax, unspecified: Secondary | ICD-10-CM | POA: Diagnosis not present

## 2023-11-17 DIAGNOSIS — Z452 Encounter for adjustment and management of vascular access device: Secondary | ICD-10-CM | POA: Diagnosis not present

## 2023-11-17 DIAGNOSIS — Z7982 Long term (current) use of aspirin: Secondary | ICD-10-CM | POA: Diagnosis not present

## 2023-11-17 DIAGNOSIS — Z91048 Other nonmedicinal substance allergy status: Secondary | ICD-10-CM | POA: Diagnosis not present

## 2023-11-18 HISTORY — PX: MITRAL VALVE REPAIR: SHX2039

## 2023-11-19 NOTE — Discharge Summary (Signed)
 CTS Discharge Summary   Admit Date: 11/17/2023  Discharge Date: 11/23/2023  1:01 PM  Admitting Physician: Reyes Renella Fruits, MD   Discharge Physician: Reyes Renella Fruits, MD  Primary Care Physician: Jason Leita Repine, NP  Referring Physician: Mona Vinie Herring*  Admission Diagnoses:  Severe mitral regurgitation [I34.0]  Discharge Diagnoses:  Principal Problem:   S/P mitral valve repair Active Problems:   Palpitations   Chest pain, unspecified   Lightheadedness   Eczema, unspecified   Acute postoperative pain Resolved Problems:   Mitral valve disease   Severe mitral valve regurgitation   Severe mitral regurgitation   Surgeries Performed: Mitral valve repair (38mm Simulus) via HP on 11/18/23  Brief History of Present Illness: Per admission H&P dated 11/17/2023: Alicia Kelley is a 33 y.o. female with history of MVP who was seen in consultation by Dr. Fruits on 7/21 for surgical evaluation of her severe MR.  ECHO at the time revealed EF >60%  with severe mitral regurgitation.  Chest CT unremarkable. At the time of consultation she endorsed symptoms of dyspnea, fatigue, and irregular heart beat. Surgical repair was recommended.  Pt presents today for evaluation of heart failure symptoms prior to surgical repair tomorrow.  Past Medical History:  Diagnosis Date  . Chest pain   . Mitral valve insufficiency    mild on last echo  . Mitral valve prolapse   . Palpitations    History of normal Holter   Past Surgical History:  Procedure Laterality Date  . REPAIR MITRAL VALVE VIA HEARTPORT N/A 11/18/2023   Procedure: ADULT, VALVULOPLASTY, MITRAL VALVE, VIA HEARTPORT, WITH CARDIOPULMONARY BYPASS; RADICALRECONSTRUCTION, WITH OR WITHOUT RING;  Surgeon: Fruits Reyes Renella, MD;  Location: [facility] OPERATING ROOMS;  Service: Cardiothoracic;  Laterality: N/A;   Family History  Problem Relation Name Age of Onset  . Coronary Artery Disease (Blocked arteries around heart)  Paternal Aunt ##Pat Aunt1   . Heart disease Maternal Grandmother    . Irregular Heart Beat (Arrhythmia) Neg Hx    . Congenital heart disease Neg Hx    . Sudden death (unexpected death due to unknown cause) Neg Hx     Social History   Socioeconomic History  . Marital status: Married  Tobacco Use  . Smoking status: Never  . Smokeless tobacco: Never  Vaping Use  . Vaping status: Never Used  Substance and Sexual Activity  . Alcohol use: Never  . Drug use: Never  . Sexual activity: Defer   Social Drivers of Health   Financial Resource Strain: Low Risk  (11/18/2023)   Overall Financial Resource Strain (CARDIA)   . Difficulty of Paying Living Expenses: Not hard at all  Food Insecurity: No Food Insecurity (11/18/2023)   Hunger Vital Sign   . Worried About Programme Researcher, Broadcasting/film/video in the Last Year: Never true   . Ran Out of Food in the Last Year: Never true  Transportation Needs: No Transportation Needs (11/18/2023)   PRAPARE - Transportation   . Lack of Transportation (Medical): No   . Lack of Transportation (Non-Medical): No   Allergies  Allergen Reactions  . Lidocaine  Swelling    Topical Lidocaine   . Ethylenediamine Rash    Causes irritation  . Magnesium Palpitations  . Nickel Rash  . Lidocaine -Menthol  Swelling   Prior to Admission Medications  Prescriptions Last Dose Taking?  acetaminophen  (TYLENOL ) 500 MG tablet Past Month Yes  Sig: Take 500 mg by mouth every 6 (six) hours as needed  albuterol  MDI, PROVENTIL , VENTOLIN , PROAIR , HFA  90 mcg/actuation inhaler Past Month Yes  Sig: Inhale 1-2 inhalations into the lungs every 6 (six) hours as needed for Wheezing or Shortness of Breath  cetirizine (ZYRTEC) 10 MG tablet Past Month Yes  Sig: Take 10 mg by mouth once daily as needed for Allergies  clobetasoL (TEMOVATE) 0.05 % ointment 11/16/2023 Yes  Sig: APPLY 1 APPLICATION (ABOUT 2 GRAMS) OINTMENT TOPICALLY TO AFFECTED AREA TWICE DAILY FOR 30 DAYS  desonide (DESOWEN) 0.05 %  ointment 11/16/2023 Yes  Sig: Apply topically  fluconazole  (DIFLUCAN ) 150 MG tablet Past Month Yes  Sig: take 1 tablet by mouth 1 time  fluocinolone  and shower cap (DERMA-SMOOTHE ) 0.01 % scalp oil Past Month Yes  Sig: APPLY 1 APPLICATION TOPICALLY ONCE DAILY AS NEEDED EVERY SINGLE DAY NEXT 2 WEEKS, THEN TWICE A WEEK FOR 30 DAYS.  fluticasone  propionate (FLONASE ) 50 mcg/actuation nasal spray Past Month Yes  Sig: Place 2 sprays into both nostrils once daily  ibuprofen  (MOTRIN ) 200 MG tablet Past Month Yes  Sig: Take 400 mg by mouth  norethindrone (MICRONOR) 0.35 mg tablet 11/16/2023 Yes  Sig: Take 0.35 mg by mouth once daily  ruxolitinib (OPZELURA ) 1.5 % Crea  Yes  Sig: Apply 1 Application topically 2 (two) times daily as needed    Facility-Administered Medications: None   Hospital Course: Alicia Kelley is a 33 y.o. female who admitted to [facility] to undergo surgical intervention for her cardiothoracic diagnosis of severe MR.  The patient was taken to the operating room by No att. providers found, who performed the above procedure(s).  Overall, the patient tolerated the procedure well without significant difficulty or evident complication, and was transferred to the cardiothoracic surgical intensive care unit.   In the immediate postoperative period, she continued to do progress well.  Ultimately, she was transferred to the step-down unit for the remainder of her hospital course.     The hospital course was significant for the following:. 1: Hypotension: occurred during vasovagal event with bowel movement. Improved after fluid bolus given.   Rhythm at the time of discharge was normal sinus rhythm.   After discussion with the case manager, patient and family; the decision was made to have patient discharge to home. Once follow-up arrangements were confirmed, the patient was felt suitable for discharge to home on 11/23/2023  1:01 PM.  Discharge Disposition:  Home  Discharge Exam (taken from progress note on 11/21/23):  BP 105/72 (BP Location: Right upper arm, Patient Position: Sitting)   Pulse 103   Temp 36.7 C (98.1 F) (Oral)   Resp 18   Ht 172.7 cm (5' 8)   Wt 80 kg (176 lb 5.9 oz)   SpO2 98%   BMI 26.82 kg/m  Neurologic: alert and oriented x 4, moves all extremities and follows commands Cardiac: NSR 60-70's on telemetry, regular rate and rhythm, S1 and S2 present and no murmur, rub or gallop present Respiratory: RA, clear to auscultation bilaterally and breath sounds diminished bilaterally Abdomen: normal bowel sounds, soft, nontender and nondistended Extremities: pulses 2+ bilaterally, upper and lower extremities and no cyanosis, clubbing, trace BLE edema Incisions: Thoracotomy incision clean, dry and intact. Bilateral groins clean and dry, right incision groin with glue.  Lines: PIVs    Discharge Medications     PAUSE taking these medications      Details  ibuprofen  200 MG tablet Wait to take this until your doctor or other care provider tells you to start again. Commonly known as: MOTRIN   400  mg Refills: 0       New Medications      Details  aspirin 81 MG chewable tablet Start taking on: November 24, 2023  81 mg, Oral, Daily Quantity: 30 tablet Refills: 1   FUROsemide 20 MG tablet Commonly known as: LASIX Start taking on: November 24, 2023  20 mg, Oral, Daily Quantity: 30 tablet Refills: 1   gabapentin 100 MG capsule Commonly known as: NEURONTIN  100 mg, Oral, 3 times Daily PRN Quantity: 42 capsule Refills: 0   hydrOXYzine  pamoate 25 MG capsule Commonly known as: VISTARIL   25 mg, Oral, Every 6 hours PRN Quantity: 30 capsule Refills: 0   methocarbamoL 750 MG tablet Commonly known as: ROBAXIN  750 mg, Oral, 3 times Daily PRN Quantity: 42 tablet Refills: 0   oxyCODONE  5 MG immediate release tablet Commonly known as: ROXICODONE   5-10 mg, Oral, Every 4 hours PRN Quantity: 84 tablet Refills: 0    polyethylene glycol packet Commonly known as: MIRALAX  17 g, Oral, Daily PRN, Mix in 4-8ounces of fluid prior to taking. Refills: 0   potassium chloride 20 MEQ ER tablet Commonly known as: KLOR-CON M20 Start taking on: November 24, 2023  20 mEq, Oral, Daily, Please take with lasix. If lasix is stopped, please stop potassium. Quantity: 30 tablet Refills: 1   sennosides-docusate 8.6-50 mg tablet Commonly known as: SENOKOT-S  2 tablets, Oral, 2 times Daily PRN Refills: 0       Medications To Continue      Details  acetaminophen  500 MG tablet Commonly known as: TYLENOL   500 mg, Every 6 hours PRN Refills: 0   albuterol  MDI (PROVENTIL , VENTOLIN , PROAIR ) HFA 90 mcg/actuation inhaler  1-2 inhalations, Every 6 hours PRN Refills: 0   cetirizine 10 MG tablet Commonly known as: ZyrTEC  10 mg, Daily PRN Refills: 0   clobetasoL 0.05 % ointment Commonly known as: TEMOVATE  APPLY 1 APPLICATION (ABOUT 2 GRAMS) OINTMENT TOPICALLY TO AFFECTED AREA TWICE DAILY FOR 30 DAYS Refills: 0   desonide 0.05 % ointment Commonly known as: DESOWEN  Apply topically Refills: 0   fluocinolone  and shower cap 0.01 % scalp oil Commonly known as: DERMA-SMOOTHE   APPLY 1 APPLICATION TOPICALLY ONCE DAILY AS NEEDED EVERY SINGLE DAY NEXT 2 WEEKS, THEN TWICE A WEEK FOR 30 DAYS. Refills: 0   fluticasone  propionate 50 mcg/actuation nasal spray Commonly known as: FLONASE   2 sprays, Daily Refills: 0   norethindrone 0.35 mg tablet Commonly known as: MICRONOR  0.35 mg, Daily Refills: 0   OPZELURA  1.5 % Crea Generic drug: ruxolitinib  1 Application, 2 times Daily PRN Refills: 0       Stopped Medications    fluconazole  150 MG tablet Commonly known as: DIFLUCAN         Antiplatelets:  prescribed  Statins/Antilipid Agent(s):  not indicated  Beta Blocker: not indicated  ACE Inhibitor/ARB:  not indicated (EF nml)   Anticoagulation:  N/A  DISCHARGE INSTRUCTIONS:   Antibiotic  Prophylaxis: Patient will require antibiotics prior to any dental procedures.  Please contact your provider prior to any dental cleanings or invasive procedures.  No dental cleanings for 3 months after surgery.  Wound care: Monitor wounds daily. Keep clean, dry, and intact.  Activity:  - Ambulate 2-3 times daily - May drive in 14 days if off of narcotic pain medication.  - Do not lift anything greater than 5-10 pounds for 14 days.  May return to work: When cleared by your surgeon   Cardiac Rehab:  yes - outpatient ambulatory rehab referral placed   Discharge Diet: Active Orders  There are no active orders of the following types: Diet.    DISCHARGE LABORATORIES (last 3 days): Recent Labs  Lab 11/20/23 0300 11/21/23 1719 11/23/23 0540  NA 136 137 136  K 4.1 3.9 3.8  CL 104 100 99  CO2 26 26 27   BUN 5* 6* 10  CREATININE 0.7 0.8 0.8  GLUCOSE 94 89 94  CALCIUM 8.1* 9.0 9.2   Recent Labs  Lab 11/20/23 0300 11/21/23 1719 11/23/23 0540  WBC 11.5* 11.1* 9.8  HGB 10.2* 13.3 13.8  HCT 31.0* 42.1 41.9  PLT 226 285 318   Recent Labs  Lab 11/17/23 1926  INR 1.2*   Future Appointments  Date Time Provider Department Center  12/12/2023 10:00 AM [id number]  12/12/2023 11:30 AM [facility]R 15 [facility]  12/12/2023 12:00 PM G[place], Reyes Prude, MD 2F2G [facility]    Report: chest/anginal pain like before surgery, swelling in feet, legs, or stomach, nausea or vomiting, fever greater than 101.0 F (38.3 C) degrees, bleeding, redness, swelling, pain, or drainage from surgical incision(s), bleeding or swelling at groin catheter access site, difficulty breathing or shortness of breath, dizziness, or weight gain greater than 2 lbs in 1 day or 5 lbs in 1 week  Report Issues: Mon - Fri normal working hours: Call Dr. Ricky at [phone] After hours and weekends: [phone] and ask for the CT surgery resident on call  Time Spent with  Patient: 45 minutes    WEN CHIAN OUYANG, NP 11/23/2023

## 2023-11-25 ENCOUNTER — Telehealth: Payer: Self-pay

## 2023-11-25 NOTE — Transitions of Care (Post Inpatient/ED Visit) (Signed)
 11/25/2023  Name: Alicia Kelley MRN: [id number] DOB: 1990/04/07  Today's TOC FU Call Status: Today's TOC FU Call Status:: Successful TOC FU Call Completed TOC FU Call Complete Date: 11/25/23 Patient's Name and Date of Birth confirmed.  Transition Care Management Follow-up Telephone Call Date of Discharge: 11/23/23 Discharge Facility: Other [facility]) Name of Other ([facility]) Discharge Facility: [facility] Type of Discharge: Inpatient Admission Primary Inpatient Discharge Diagnosis:: Severe MR - MVP How have you been since you were released from the hospital?: Better Any questions or concerns?: No  Items Reviewed: Did you receive and understand the discharge instructions provided?: Yes Medications obtained,verified, and reconciled?: Yes (Medications Reviewed) Any new allergies since your discharge?: No Dietary orders reviewed?: Yes Type of Diet Ordered:: Carb Modified Do you have support at home?: Yes People in Home [RPT]: spouse Name of Support/Comfort Primary Source: Bethene Hint  Medications Reviewed Today: Medications Reviewed Today     Reviewed by Moises Reusing, RN (Case Manager) on 11/25/23 at 1106  Med List Status: <None>   Medication Order Taking? Sig Documenting Provider Last Dose Status Informant  albuterol  (VENTOLIN  HFA) 108 (90 Base) MCG/ACT inhaler [id number]  Inhale 2 puffs into the lungs every 4 (four) hours as needed for wheezing or shortness of breath. Marinda Rocky SAILOR, MD  Active   aspirin EC 81 MG tablet [id number] Yes Take 81 mg by mouth daily. Swallow whole. [provider]  Active   cetirizine (ZYRTEC) 10 MG tablet [id number]  Take 10 mg by mouth daily as needed for allergies. [provider]  Active Self  clobetasol ointment (TEMOVATE) 0.05 % [id number]  Apply 1 Application topically. [provider]  Active   desonide (DESOWEN) 0.05 % ointment [id number]  Apply topically. [provider]  Active   doxycycline   (VIBRAMYCIN ) 100 MG capsule [id number]  Take 1 capsule (100 mg total) by mouth 2 (two) times daily. Maranda Jamee Jacob, MD  Active   fluconazole  (DIFLUCAN ) 150 MG tablet [id number]  Take 1 tablet (150 mg total) by mouth daily. Repeat in 1 week if needed  Patient not taking: Reported on 11/25/2023   Maranda Jamee Jacob, MD  Active   Fluocinolone  Acetonide Scalp 0.01 % OIL [id number]  Apply 1 Application topically 2 (two) times daily as needed. Marinda Rocky SAILOR, MD  Active   fluticasone  (FLONASE ) 50 MCG/ACT nasal spray [id number]  Place 2 sprays into both nostrils daily. Jason Leita Repine, FNP  Active   furosemide (LASIX) 20 MG tablet [id number] Yes Take 20 mg by mouth daily. [provider]  Active   gabapentin (NEURONTIN) 100 MG capsule [id number] Yes Take 100 mg by mouth 3 (three) times daily as needed. [provider]  Active   hydrOXYzine  (VISTARIL ) 25 MG capsule [id number] Yes Take 25 mg by mouth every 6 (six) hours as needed. [provider]  Active   ibuprofen  (ADVIL ) 200 MG tablet [id number]  Take 400 mg by mouth every 6 (six) hours as needed for moderate pain. [provider]  Active Self  methocarbamol (ROBAXIN) 750 MG tablet [id number] Yes Take 750 mg by mouth 3 (three) times daily as needed for muscle spasms. [provider]  Active   norethindrone (MICRONOR) 0.35 MG tablet [id number]  Take 1 tablet by mouth daily. [provider]  Active Self  oxycodone  (OXY-IR) 5 MG capsule [id number] Yes Take 5 mg by mouth every 4 (four) hours as needed. [provider]  Active   polyethylene glycol (MIRALAX / GLYCOLAX) 17 g packet [id number]  Yes Take 17 g by mouth daily as needed. [provider]  Active   potassium chloride (KLOR-CON) 20 MEQ packet [id number] Yes Take 20 mEq by mouth daily. [provider]  Active   Ruxolitinib Phosphate  (OPZELURA ) 1.5 % CREA [id number]  Apply 1 Application topically 2 (two) times daily as needed.  Marinda Rocky SAILOR, MD  Active             Home Care and Equipment/Supplies: Were Home Health Services Ordered?: NA Any new equipment or medical supplies ordered?: NA  Functional Questionnaire: Do you need assistance with bathing/showering or dressing?: No Do you need assistance with meal preparation?: No Do you need assistance with eating?: No Do you have difficulty maintaining continence: No Do you need assistance with getting out of bed/getting out of a chair/moving?: No Do you have difficulty managing or taking your medications?: No  Follow up appointments reviewed: PCP Follow-up appointment confirmed?: Yes Date of PCP follow-up appointment?: 12/18/23 Follow-up Provider: Melissa OSullivan Specialist [facility] Follow-up appointment confirmed?: No Reason Specialist Follow-Up Not Confirmed: Patient has Specialist Provider Number and will Call for Appointment Do you need transportation to your follow-up appointment?: No Do you understand care options if your condition(s) worsen?: Yes-patient verbalized understanding  SDOH Interventions Today    Flowsheet Row Most Recent Value  SDOH Interventions   Food Insecurity Interventions Intervention Not Indicated  Housing Interventions Intervention Not Indicated  Transportation Interventions Intervention Not Indicated  Utilities Interventions Intervention Not Indicated    Medford Balboa, BSN, RN [place]  [facility] - [facility] RN Care Manager [phone]

## 2023-11-28 ENCOUNTER — Telehealth (HOSPITAL_COMMUNITY): Payer: Self-pay

## 2023-11-28 NOTE — Telephone Encounter (Signed)
 Pt called wanting to see about cardiac rehab, I advised pt that she would have to complete her f/u at her cardiologist office that's scheduled on 11/12 and she would have to complete her f/u with her surgeon that is scheduled on 11/20. I advised pt that we would need an EKG tracing along with a cardiac rehab referral.

## 2023-12-04 ENCOUNTER — Encounter: Payer: Self-pay | Admitting: Internal Medicine

## 2023-12-04 ENCOUNTER — Ambulatory Visit: Attending: Student in an Organized Health Care Education/Training Program | Admitting: Internal Medicine

## 2023-12-04 VITALS — BP 90/58 | HR 87 | Resp 16 | Ht 68.0 in | Wt 177.0 lb

## 2023-12-04 DIAGNOSIS — I951 Orthostatic hypotension: Secondary | ICD-10-CM | POA: Diagnosis not present

## 2023-12-04 DIAGNOSIS — I341 Nonrheumatic mitral (valve) prolapse: Secondary | ICD-10-CM

## 2023-12-04 DIAGNOSIS — Z8679 Personal history of other diseases of the circulatory system: Secondary | ICD-10-CM | POA: Diagnosis not present

## 2023-12-04 DIAGNOSIS — Z9889 Other specified postprocedural states: Secondary | ICD-10-CM

## 2023-12-04 NOTE — Patient Instructions (Addendum)
 Medication Instructions:   STOP lasix and potassium  *If you need a refill on your cardiac medications before your next appointment, please call your pharmacy*   Follow-Up: At [facility], you and your health needs are our priority.  As part of our continuing mission to provide you with exceptional heart care, our providers are all part of one team.  This team includes your primary Cardiologist (physician) and Advanced Practice Providers or APPs (Physician Assistants and Nurse Practitioners) who all work together to provide you with the care you need, when you need it.  Your next appointment:    3 months with: - Damien Braver NP - Katlyn West NP - Rollo Louder PA - [place] PA  We recommend signing up for the patient portal called MyChart.  Sign up information is provided on this After Visit Summary.  MyChart is used to connect with patients for Virtual Visits (Telemedicine).  Patients are able to view lab/test results, encounter notes, upcoming appointments, etc.  Non-urgent messages can be sent to your provider as well.   To learn more about what you can do with MyChart, go to [email or web address].   Other Instructions

## 2023-12-04 NOTE — Progress Notes (Signed)
 OFFICE NOTE  Chief Complaint:  Follow-up mitral regurgitation  Primary Care Physician: Jason Leita Repine, FNP (Inactive)  HPI:  Alicia Kelley is a 33 year old female who recently graduated from [employer]. 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 Her past medical history is significant for heart murmur which was detected at a young age. She side do cardiologist in [place] who was a pediatric cardiologist. She apparently had an echo in the past and described a valvular abnormality which sounds like mitral valve prolapse. Subsequently she transitioned to a cardiologist in [place] but cannot remember the name of the cardiologist. She's had an echocardiogram before but no records of that are available. Recently she's been having a number of symptoms which are concerning. She is reporting chest pain, shortness of breath, particularly walking up stairs, headache which sounds migrainous in nature-there is photophobia and phonophobia with associated nausea and light sensitivity. In addition she is reporting palpitations. Those occur mostly at night. Her chest pain is sharp and not necessarily associated with exertion. It seems to last for only a few minutes. She does report some anxiety particularly about the symptoms. She recently had screening blood work and physical through her primary care provider. Cholesterol appears to be well-controlled. Her EKG shows sinus rhythm but there are some nonspecific ST and T-wave changes. Family history is significant for murmur in her brother and father.  I the pleasure see Ms. Alicia Kelley back today in follow-up. She underwent an exercise treadmill stress test which was low risk however she had interim development of PAT or possibly atrial tachycardia during the procedure. This likely explains the palpitation she's been feeling. A repeat echo was performed  which shows normal LV function however she does have thickening and bileaflet prolapse of the mitral valve and moderate regurgitation. This is similar to the findings of her echo which was performed by [facility] pediatric cardiology in 2014. She was previously seeing Dr. Lavonia. The findings now indicate that she does have some left atrial enlargement which is mild and was not previously present.  08/19/2019  Ms. Alicia Kelley is here to reestablish cardiac care.  She is considered a new patient since I have not seen her in more than 3 years.  It sounds like overall she has been doing well until recently when she has developed some palpitations.  Those were more frequent several weeks ago.  She had changed her diet around some and was drinking a tea which apparently contained twice as much caffeine is coffee and I suspect this may have provoked her palpitations.  We had discovered that she has moderate mitral valve prolapse with mild to moderate mitral regurgitation and some left atrial enlargement by echo in 2016.  She is not had a repeat echo since then.  Overall she denies any chest pain or worsening shortness of breath with exertion.  10/03/2022  Alicia Kelley is seen today in follow-up.  She recently changed her name although has been married since 2020.  She was referred to see Dr. Whitman for evaluation of severe mitral regurgitation.  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  He gave her possibly 90% chance of successful repair.  We then have to think about what options it would be for replacement if not successful and this could likely include mechanical valve given her younger age.  This would necessitate warfarin  anticoagulation and if she would consider having another child it could be an issue for her.  He felt that at this point since she was asymptomatic he would recommend monitoring further.  She returns today and continues to have no significant symptoms except  with marked exercise.  She does feel some fullness or tightness in her chest when exercising and heart rate gets up into about the 150s.  She gets some occasional palpitations otherwise but nothing sustained.  04/11/2023  Alicia Kelley returns today for follow-up of mitral regurgitation.  She saw Dr. Maryjane who felt that there was a 90% chance her valve could be repairable although given her young age there is a risk that she may need to make mechanical valve and would be on light lifelong anticoagulation.  This could be an issue as she is considering having another child.  She had been asymptomatic.  Over the fall and in early winter she had some viral illnesses related to her child at daycare.  She reports when she is sick she is very slow to recover and has been more short of breath.  Recently she has had to prop up her pillow more at night.  She gets short of breath when walking up stairs or carrying her child which sounds like she is developing some symptomatic mitral regurgitation.  12/04/2023  Alicia Kelley returns today for follow-up.  She underwent recent mitral valve surgery by Dr. Ricky at [facility].  She had developed symptoms including worsening shortness of breath with her severe mitral regurgitation and underwent successful valve repair via right thoracotomy approach on 11/18/2023 with a 38 mm Simulus valve repair.  Subsequent echo performed on November 22, 2023 showed normal LVEF 56% with normal diastolic function, mild MR and trivial TR, PI and AI.  During her hospitalization she had an episode of hypotension which was thought to be due to a vasovagal event and improved with a fluid bolus.  She returns today and says she is feeling a little better every day but does still have some pain.  She is trying to wean down her oxycodone  which she is taking every 17 or 18 hours and her gabapentin which is about twice a day.  She remains on Lasix 20 mg daily.  She does describe some orthostatic type  symptoms including dizziness with change in position.  I wonder if this is because of some degree of dehydration.  She is otherwise on no antihypertensive medications.  She reports some incisional tenderness and does have a follow-up with her surgeon tomorrow.  PMHx:  Past Medical History:  Diagnosis Date   At high risk for breast cancer 12/10/2022   Lifetime risk (based on Beatrice Harvey) is 23.4% (calculated in November 2024) Recommended annual mammograms and consideration for annual breast MRIs    Eczema    H/O mitral valve prolapse    moderate regurgitation, mild left atrial enlargement   Mitral valve prolapse    Urticaria     Past Surgical History:  Procedure Laterality Date   NO PAST SURGERIES     TEE WITHOUT CARDIOVERSION N/A 06/15/2022   Procedure: TRANSESOPHAGEAL ECHOCARDIOGRAM;  Surgeon: Lonni Slain, MD;  Location: [facility] INVASIVE CV LAB;  Service: Cardiovascular;  Laterality: N/A;    FAMHx:  Family History  Problem Relation Age of Onset   Allergic rhinitis Mother    Hypertension Mother    Healthy Father    Heart murmur Sister    Allergic rhinitis Brother    Urticaria Maternal  Aunt    Allergic rhinitis Maternal Aunt    Breast cancer Paternal Aunt        dx <50   Heart attack Paternal Uncle 38   Stroke Maternal Grandmother    Prostate cancer Maternal Grandfather        dx 81s; no mets   Breast cancer Paternal Grandmother 39   Colon cancer Paternal Grandfather        dx <50   Prostate cancer Paternal Grandfather    Atopy Neg Hx     SOCHx:   reports that she has never smoked. She has never used smokeless tobacco. She reports current alcohol use. She reports that she does not use drugs.  ALLERGIES:  Allergies  Allergen Reactions   Ethylenediamine Dihydrochloride [Ethylenediamine] Rash    Causes irritation    Magnesium Palpitations   Nickel Rash   Lidocaine  Swelling    Pt. Only reacts to the topical lidocaine     Lidocaine -Menthol  Swelling     ROS: Pertinent items noted in HPI and remainder of comprehensive ROS otherwise negative.  HOME MEDS: Current Outpatient Medications  Medication Sig Dispense Refill   aspirin EC 81 MG tablet Take 81 mg by mouth daily. Swallow whole.     furosemide (LASIX) 20 MG tablet Take 20 mg by mouth daily.     gabapentin (NEURONTIN) 100 MG capsule Take 100 mg by mouth 3 (three) times daily as needed.     hydrOXYzine  (VISTARIL ) 25 MG capsule Take 25 mg by mouth every 6 (six) hours as needed.     methocarbamol (ROBAXIN) 750 MG tablet Take 750 mg by mouth 3 (three) times daily as needed for muscle spasms.     oxycodone  (OXY-IR) 5 MG capsule Take 5 mg by mouth every 4 (four) hours as needed.     polyethylene glycol (MIRALAX / GLYCOLAX) 17 g packet Take 17 g by mouth daily as needed.     potassium chloride (KLOR-CON) 20 MEQ packet Take 20 mEq by mouth daily.     albuterol  (VENTOLIN  HFA) 108 (90 Base) MCG/ACT inhaler Inhale 2 puffs into the lungs every 4 (four) hours as needed for wheezing or shortness of breath. (Patient not taking: Reported on 12/04/2023) 6.7 g 1   cetirizine (ZYRTEC) 10 MG tablet Take 10 mg by mouth daily as needed for allergies. (Patient not taking: Reported on 12/04/2023)     clobetasol ointment (TEMOVATE) 0.05 % Apply 1 Application topically. (Patient not taking: Reported on 12/04/2023)     desonide (DESOWEN) 0.05 % ointment Apply topically. (Patient not taking: Reported on 12/04/2023)     Fluocinolone  Acetonide Scalp 0.01 % OIL Apply 1 Application topically 2 (two) times daily as needed. (Patient not taking: Reported on 12/04/2023) 118 mL 5   fluticasone  (FLONASE ) 50 MCG/ACT nasal spray Place 2 sprays into both nostrils daily. (Patient not taking: Reported on 12/04/2023) 16 g 6   norethindrone (MICRONOR) 0.35 MG tablet Take 1 tablet by mouth daily. (Patient not taking: Reported on 12/04/2023)     Ruxolitinib Phosphate  (OPZELURA ) 1.5 % CREA Apply 1 Application topically 2 (two) times  daily as needed. (Patient not taking: Reported on 12/04/2023) 60 g 5   No current facility-administered medications for this visit.    LABS/IMAGING: No results found for this or any previous visit (from the past 48 hours). No results found.  WEIGHTS: Wt Readings from Last 3 Encounters:  12/04/23 177 lb (80.3 kg)  08/29/23 128 lb 3.2 oz (58.2 kg)  07/11/23 179 lb 12.8  oz (81.6 kg)    VITALS: BP (!) 90/58 (BP Location: Left Arm, Patient Position: Sitting, Cuff Size: Normal)   Pulse 87   Resp 16   Ht 5' 8 (1.727 m)   Wt 177 lb (80.3 kg)   SpO2 98%   BMI 26.91 kg/m   EXAM: General appearance: alert and no distress Neck: no carotid bruit, no JVD, and thyroid not enlarged, symmetric, no tenderness/mass/nodules Lungs: clear to auscultation bilaterally Heart: regular rate and rhythm, S1, S2 normal, no murmur, click, rub or gallop Abdomen: soft, non-tender; bowel sounds normal; no masses,  no organomegaly Extremities: extremities normal, atraumatic, no cyanosis or edema Pulses: 2+ and symmetric Skin: Skin color, texture, turgor normal. No rashes or lesions Neurologic: Grossly normal Psych: Pleasant  EKG: EKG Interpretation Date/Time:  Wednesday December 04 2023 09:19:27 EST Ventricular Rate:  88 PR Interval:  164 QRS Duration:  80 QT Interval:  386 QTC Calculation: 467 R Axis:   16  Text Interpretation: Normal sinus rhythm Possible Left atrial enlargement Cannot rule out Anterior infarct , age undetermined When compared with ECG of 11-Apr-2023 13:30, T wave inversion no longer evident in Inferior leads Confirmed by Mona Kent [phone]) on 12/04/2023 9:35:26 AM   ASSESSMENT: Mitral valve prolapse with severe eccentric regurgitation -status post mitral valve repair at [facility] with a 38 mm simulus ring (Dr. Ricky) - 11/18/2023 Orthostatic hypotension Palpitations Left atrial enlargement PAT or paroxysmal atrial flutter Chest pain Headaches with migraine  features Anxiety  PLAN: 1.   Mrs. Brzoska has had some symptoms of orthostatic hypotension and had a possible vagal event while hospitalized that improved with fluids.  She still remains hypotensive with blood pressure 90/58 today.  I advised her to stop her Lasix and potassium, but to monitor for signs of volume overload which she could use Lasix as needed.  I also encouraged her to continue to wean off her narcotic and back off on gabapentin as this may be contributing to lower blood pressure.  She does have follow-up with her surgeon tomorrow at [facility].  Postprocedure echo did show some mild mitral regurgitation which is significantly improved from the preprocedure findings.  Will arrange for follow-up with APP in about 3 months.  Kent KYM Mona, MD, [facility], FNLA, FACP  [place]  [facility]  Medical Director of the [facility]  Cardiovascular Risk Reduction Clinic Diplomate of the American Board of Clinical Lipidology Attending Cardiologist  Direct Dial: [phone]  Fax: [phone]  Website:  [email or web address] Kent BROCKS Raela Bohl 12/04/2023, 10:37 AM

## 2023-12-05 DIAGNOSIS — R918 Other nonspecific abnormal finding of lung field: Secondary | ICD-10-CM | POA: Diagnosis not present

## 2023-12-05 DIAGNOSIS — J939 Pneumothorax, unspecified: Secondary | ICD-10-CM | POA: Diagnosis not present

## 2023-12-05 DIAGNOSIS — Z9889 Other specified postprocedural states: Secondary | ICD-10-CM | POA: Diagnosis not present

## 2023-12-05 DIAGNOSIS — D649 Anemia, unspecified: Secondary | ICD-10-CM | POA: Diagnosis not present

## 2023-12-16 ENCOUNTER — Telehealth (HOSPITAL_COMMUNITY): Payer: Self-pay

## 2023-12-16 NOTE — Telephone Encounter (Signed)
 Pt is interested in the cardiac rehab program, faxed over documentation to [facility]'s office at Fax: [phone]. Waiting on MD order and 12 lead EKG.

## 2023-12-18 ENCOUNTER — Encounter: Payer: Self-pay | Admitting: Family

## 2023-12-18 ENCOUNTER — Ambulatory Visit (INDEPENDENT_AMBULATORY_CARE_PROVIDER_SITE_OTHER): Admitting: Family

## 2023-12-18 ENCOUNTER — Telehealth: Payer: Self-pay | Admitting: Family

## 2023-12-18 VITALS — BP 105/62 | HR 93 | Temp 98.4°F | Resp 16 | Ht 68.0 in | Wt 179.0 lb

## 2023-12-18 DIAGNOSIS — L301 Dyshidrosis [pompholyx]: Secondary | ICD-10-CM

## 2023-12-18 DIAGNOSIS — J4520 Mild intermittent asthma, uncomplicated: Secondary | ICD-10-CM | POA: Diagnosis not present

## 2023-12-18 DIAGNOSIS — N912 Amenorrhea, unspecified: Secondary | ICD-10-CM | POA: Insufficient documentation

## 2023-12-18 DIAGNOSIS — H6122 Impacted cerumen, left ear: Secondary | ICD-10-CM | POA: Diagnosis not present

## 2023-12-18 DIAGNOSIS — Z309 Encounter for contraceptive management, unspecified: Secondary | ICD-10-CM

## 2023-12-18 DIAGNOSIS — Z9889 Other specified postprocedural states: Secondary | ICD-10-CM | POA: Diagnosis not present

## 2023-12-18 DIAGNOSIS — Z803 Family history of malignant neoplasm of breast: Secondary | ICD-10-CM

## 2023-12-18 LAB — POCT URINE PREGNANCY: Preg Test, Ur: NEGATIVE

## 2023-12-18 MED ORDER — NORETHINDRONE 0.35 MG PO TABS: 1.0000 | ORAL_TABLET | Freq: Every day | ORAL | Status: AC

## 2023-12-18 NOTE — Patient Instructions (Signed)
  VISIT SUMMARY: You had a follow-up appointment today after your mitral valve repair surgery. You mentioned feeling okay but still experiencing some soreness and shortness of breath with activities. We discussed your ongoing symptoms and management of your other health issues.  YOUR PLAN: -STATUS POST MITRAL VALVE REPAIR FOR NONRHEUMATIC MITRAL INSUFFICIENCY: This means you had surgery to repair a valve in your heart that was not closing properly. Your post-operative hypotension has resolved, and your breathing and stamina have improved, although you still experience shortness of breath with exertion. You should continue with cardiac rehabilitation starting December 9th, 2025, and monitor your symptoms.  -AMENORRHEA: Amenorrhea means you have missed your menstrual periods. This started after you stopped taking norethindrone . Your home pregnancy test was negative, and we performed a urine pregnancy test today. If this test is also negative, you should resume taking norethindrone  to prevent pregnancy. Monitor for any continued breast discharge and let us  know if it persists.  -ECZEMA AND URTICARIA: Eczema and urticaria are skin conditions that cause itching and inflammation. You are not currently experiencing any flare-ups. Continue managing these conditions with Zyrtec, desonide, and fluocinolone  as needed.  -CERUMEN IMPACTION, LEFT EAR: Cerumen impaction means you have a buildup of earwax in your left ear, causing discomfort and hearing issues. We flushed your left ear to remove the earwax and examined your eardrum to ensure there were no other issues.  INSTRUCTIONS: Please follow up with cardiac rehabilitation starting December 9th, 2025. If your urine pregnancy test is negative, resume taking norethindrone . Monitor your symptoms and let us  know if you experience any new or worsening issues.                      Contains text generated by Abridge.                                  Contains text generated by Abridge.

## 2023-12-18 NOTE — Telephone Encounter (Signed)
 [facility] Eleanor Jury physician, please request copy of her last pap.

## 2023-12-18 NOTE — Assessment & Plan Note (Signed)
 Stable with topical steroids. Continue same.

## 2023-12-18 NOTE — Progress Notes (Signed)
 Subjective:     Patient ID: Alicia Kelley, female    DOB: 02-Nov-1990, 33 y.o.   MRN: [id number]  Chief Complaint  Patient presents with   Hospitalization Follow-up    HPI  Discussed the use of AI scribe software for clinical note transcription with the patient, who gave verbal consent to proceed.  History of Present Illness Alicia Kelley is a 33 year old female who presents for post-operative follow-up after mitral valve repair.  She underwent mitral valve repair surgery on November 18, 2023, using her own valve. Post-operatively, she experienced an episode of hypotension and nearly fainted, which was resolved with fluid administration. She feels 'okay' but still experiences soreness and tenderness, particularly a constant pain between her shoulder blades, which she manages with Advil  and daily aspirin. She has not resumed stronger pain medications. Her ability to converse without shortness of breath has improved, although she still experiences shortness of breath with activities like climbing stairs.  Her mitral valve issue has been a long-standing condition, initially identified as a heart murmur in infancy. She noticed a 'flutter' in her chest during activities in middle school. She intermittently followed up with cardiology, with a significant gap during college and early adulthood due to financial constraints. She resumed cardiology care after having her child, leading to the recent surgery.  She has a history of eczema and urticaria, for which she uses Zyrtec, Timovate, desonide ointment, and fluocinolone  as needed. She specifies using fluocinolone  for her scalp and desonide for her hands. She also uses albuterol  as needed for respiratory issues, particularly when experiencing upper respiratory symptoms, although she does not have a formal asthma diagnosis.  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  She enjoys arts and crafts and gardening in her free time. She reports occasional alcohol use and no drug use.      Health Maintenance Due  Topic Date Due   Hepatitis C Screening  Never done   DTaP/Tdap/Td (1 - Tdap) Never done   Pneumococcal Vaccine (1 of 2 - PCV) Never done   Hepatitis B Vaccines 19-59 Average Risk (1 of 3 - 19+ 3-dose series) Never done   HPV VACCINES (1 - 3-dose SCDM series) Never done   Cervical Cancer Screening (HPV/Pap Cotest)  Never done   COVID-19 Vaccine (3 - 2025-26 season) 09/23/2023    Past Medical History:  Diagnosis Date   At high risk for breast cancer 12/10/2022   Lifetime risk (based on Beatrice Harvey) is 23.4% (calculated in November 2024) Recommended annual mammograms and consideration for annual breast MRIs    Eczema    H/O mitral valve prolapse    moderate regurgitation, mild left atrial enlargement   Mitral valve prolapse    MVP (mitral valve prolapse) 02/01/2020   Echo Aug 2021     Severe mitral regurgitation 12/22/2014   Echo Aug 2021     SVD (spontaneous vaginal delivery) 07/03/2020   Urticaria     Past Surgical History:  Procedure Laterality Date   MITRAL VALVE REPAIR  11/18/2023   ADULT, VALVULOPLASTY, MITRAL VALVE, VIA HEARTPORT, WITH CARDIOPULMONARY BYPASS; RADICALRECONSTRUCTION   NO PAST SURGERIES     TEE WITHOUT CARDIOVERSION N/A 06/15/2022   Procedure: TRANSESOPHAGEAL ECHOCARDIOGRAM;  Surgeon: Lonni Slain, MD;  Location: [facility] INVASIVE CV LAB;  Service: Cardiovascular;  Laterality: N/A;    Family History  Problem Relation Age of Onset   Allergic rhinitis Mother  Hypertension Mother    Colon polyps Father    Heart murmur Sister    Allergic rhinitis Brother    Stroke Maternal Grandmother    Prostate cancer Maternal Grandfather        dx 14s; no mets   Lung cancer Maternal Grandfather    Breast cancer Paternal Grandmother 54   Colon cancer Paternal Grandfather        dx <50   Prostate cancer Paternal  Grandfather    Urticaria Maternal Aunt    Allergic rhinitis Maternal Aunt    Breast cancer Paternal Aunt        dx <50   Heart attack Paternal Uncle 47   Atopy Neg Hx     Social History   Socioeconomic History   Marital status: Married    Spouse name: Not on file   Number of children: Not on file   Years of education: Not on file   Highest education level: Not on file  Occupational History   Not on file  Tobacco Use   Smoking status: Never   Smokeless tobacco: Never  Vaping Use   Vaping status: Never Used  Substance and Sexual Activity   Alcohol use: Yes    Comment: socially   Drug use: Never   Sexual activity: Yes  Other Topics Concern   Not on file  Social History Narrative   Son Bethene 2022   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    Social Drivers of Corporate Investment Banker Strain: Low Risk  (11/18/2023)   Received from [facility]   Overall Financial Resource Strain (CARDIA)    Difficulty of Paying Living Expenses: Not hard at all  Food Insecurity: No Food Insecurity (11/25/2023)   Hunger Vital Sign    Worried About Running Out of Food in the Last Year: Never true    Ran Out of Food in the Last Year: Never true  Transportation Needs: No Transportation Needs (11/25/2023)   PRAPARE - Administrator, Civil Service (Medical): No    Lack of Transportation (Non-Medical): No  Physical Activity: Not on file  Stress: Not on file  Social Connections: Not on file  Intimate Partner Violence: Not At Risk (11/25/2023)   Humiliation, Afraid, Rape, and Kick questionnaire    Fear of Current or Ex-Partner: No    Emotionally Abused: No    Physically Abused: No    Sexually Abused: No    Outpatient Medications Prior to Visit  Medication Sig  Dispense Refill   albuterol  (VENTOLIN  HFA) 108 (90 Base) MCG/ACT inhaler Inhale 2 puffs into the lungs every 4 (four) hours as needed for wheezing or shortness of breath. 6.7 g 1   aspirin EC 81 MG tablet Take 81 mg by mouth daily. Swallow whole.     cetirizine (ZYRTEC) 10 MG tablet Take 10 mg by mouth daily as needed for allergies.     clobetasol ointment (TEMOVATE) 0.05 % Apply 1 Application topically.     desonide (DESOWEN) 0.05 % ointment Apply topically.     Fluocinolone  Acetonide Scalp 0.01 % OIL Apply 1 Application topically 2 (two) times daily as needed. 118 mL 5   fluticasone  (FLONASE ) 50 MCG/ACT nasal spray Place 2 sprays into both nostrils daily. 16  g 6   furosemide (LASIX) 20 MG tablet Take 20 mg by mouth daily.     gabapentin (NEURONTIN) 100 MG capsule Take 100 mg by mouth 3 (three) times daily as needed.     hydrOXYzine  (VISTARIL ) 25 MG capsule Take 25 mg by mouth every 6 (six) hours as needed.     methocarbamol (ROBAXIN) 750 MG tablet Take 750 mg by mouth 3 (three) times daily as needed for muscle spasms.     norethindrone  (MICRONOR ) 0.35 MG tablet Take 1 tablet by mouth daily. (Patient not taking: Reported on 12/04/2023)     oxycodone  (OXY-IR) 5 MG capsule Take 5 mg by mouth every 4 (four) hours as needed.     polyethylene glycol (MIRALAX / GLYCOLAX) 17 g packet Take 17 g by mouth daily as needed.     potassium chloride (KLOR-CON) 20 MEQ packet Take 20 mEq by mouth daily.     Ruxolitinib Phosphate  (OPZELURA ) 1.5 % CREA Apply 1 Application topically 2 (two) times daily as needed. (Patient not taking: Reported on 12/04/2023) 60 g 5   No facility-administered medications prior to visit.    Allergies  Allergen Reactions   Ethylenediamine Dihydrochloride [Ethylenediamine] Rash    Causes irritation    Magnesium Palpitations   Nickel Rash   Lidocaine  Swelling    Pt. Only reacts to the topical lidocaine     Lidocaine -Menthol  Swelling    ROS    See HPI Objective:     Physical Exam Constitutional:      General: She is not in acute distress.    Appearance: Normal appearance. She is well-developed.  HENT:     Head: Normocephalic and atraumatic.     Right Ear: Tympanic membrane, ear canal and external ear normal.     Left Ear: External ear normal. There is impacted cerumen.  Eyes:     General: No scleral icterus. Neck:     Thyroid: No thyromegaly.  Cardiovascular:     Rate and Rhythm: Normal rate and regular rhythm.     Heart sounds: Normal heart sounds. No murmur heard. Pulmonary:     Effort: Pulmonary effort is normal. No respiratory distress.     Breath sounds: Normal breath sounds. No wheezing.  Musculoskeletal:     Cervical back: Neck supple.  Skin:    General: Skin is warm and dry.  Neurological:     Mental Status: She is alert and oriented to person, place, and time.  Psychiatric:        Mood and Affect: Mood normal.        Behavior: Behavior normal.        Thought Content: Thought content normal.        Judgment: Judgment normal.      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 Improved breathing and stamina, but dyspnea on  exertion persists. - Begin cardiac rehabilitation as scheduled to begin December 9th, 2025. Hopefully this will help with DOE.  -Pt appears euvolemic without furosemide.  - Monitor symptoms and adjust treatment as necessary.      Mild intermittent asthma without complication   Currently  stable. Has albuterol  for prn use. Pt feels that her cardiac symptoms were a major contributor.       Family history of breast cancer   Advised pt to consider genetic testing through our [employer] program which is free due to strong family history of cancer.       Excessive cerumen in left ear canal   After verbal consent, left ear was irrigated by CMA with successful removal of cerumen- revealed normal L TM.       Dyshidrotic eczema   Stable with topical steroids. Continue same.       Amenorrhea - Primary   Relevant Orders   POCT urine pregnancy (Completed)  Urine pregnancy test negative. Restart Norethindrone  for contraception. She had one episode of nipple discharge bilaterally- advised her to let me know if this recurs and will check a prolactin level.  Assessment & Plan  I personally spent a total of 45 minutes in the care of the patient today including preparing to see the patient, performing a medically appropriate exam/evaluation, counseling and educating, placing orders, and independently interpreting results.  I have discontinued Roshunda Dassow's norethindrone , Opzelura , furosemide, gabapentin, hydrOXYzine , methocarbamol, oxycodone , polyethylene glycol, and potassium chloride. I am also having her maintain her cetirizine, fluticasone , desonide, clobetasol ointment, Fluocinolone  Acetonide Scalp, albuterol , and aspirin EC.  No orders of the defined types were placed in this encounter.

## 2023-12-18 NOTE — Assessment & Plan Note (Signed)
 Advised pt to consider genetic testing through our [employer] program which is free due to strong family history of cancer.

## 2023-12-18 NOTE — Assessment & Plan Note (Signed)
 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 Improved breathing and stamina, but dyspnea on exertion persists. - Begin cardiac rehabilitation as scheduled to begin December 9th, 2025. Hopefully this will help with DOE.  -Pt appears euvolemic without furosemide.  - Monitor symptoms and adjust treatment as necessary.

## 2023-12-18 NOTE — Telephone Encounter (Signed)
 Request faxed

## 2023-12-18 NOTE — Assessment & Plan Note (Signed)
 Currently stable. Has albuterol  for prn use. Pt feels that her cardiac symptoms were a major contributor.

## 2023-12-18 NOTE — Assessment & Plan Note (Signed)
 After verbal consent, left ear was irrigated by CMA with successful removal of cerumen- revealed normal L TM.

## 2023-12-22 ENCOUNTER — Other Ambulatory Visit (HOSPITAL_COMMUNITY): Payer: Self-pay | Admitting: *Deleted

## 2023-12-22 DIAGNOSIS — Z9889 Other specified postprocedural states: Secondary | ICD-10-CM

## 2023-12-23 ENCOUNTER — Encounter: Payer: Self-pay | Admitting: *Deleted

## 2023-12-24 ENCOUNTER — Telehealth (HOSPITAL_COMMUNITY): Payer: Self-pay

## 2023-12-24 NOTE — Telephone Encounter (Addendum)
 Received, placed in your folder. Its already abstracted.

## 2023-12-24 NOTE — Telephone Encounter (Signed)
 Attempted to call regarding cardiac rehab- no answer, left message. Sent MyChart message.

## 2023-12-27 ENCOUNTER — Encounter: Payer: Self-pay | Admitting: Family

## 2023-12-31 DIAGNOSIS — Z9889 Other specified postprocedural states: Secondary | ICD-10-CM | POA: Diagnosis not present

## 2024-01-01 DIAGNOSIS — Z9889 Other specified postprocedural states: Secondary | ICD-10-CM | POA: Diagnosis not present

## 2024-01-02 DIAGNOSIS — Z9889 Other specified postprocedural states: Secondary | ICD-10-CM | POA: Diagnosis not present

## 2024-01-06 DIAGNOSIS — Z9889 Other specified postprocedural states: Secondary | ICD-10-CM | POA: Diagnosis not present

## 2024-01-07 ENCOUNTER — Telehealth (HOSPITAL_COMMUNITY): Payer: Self-pay

## 2024-01-07 NOTE — Telephone Encounter (Signed)
 Attempted f/u call regarding cardiac rehab- no answer, left message. Closing referral.

## 2024-01-08 DIAGNOSIS — Z9889 Other specified postprocedural states: Secondary | ICD-10-CM | POA: Diagnosis not present

## 2024-01-09 DIAGNOSIS — Z9889 Other specified postprocedural states: Secondary | ICD-10-CM | POA: Diagnosis not present

## 2024-01-13 DIAGNOSIS — Z9889 Other specified postprocedural states: Secondary | ICD-10-CM | POA: Diagnosis not present

## 2024-01-15 DIAGNOSIS — Z9889 Other specified postprocedural states: Secondary | ICD-10-CM | POA: Diagnosis not present

## 2024-01-20 DIAGNOSIS — Z9889 Other specified postprocedural states: Secondary | ICD-10-CM | POA: Diagnosis not present

## 2024-01-22 DIAGNOSIS — Z9889 Other specified postprocedural states: Secondary | ICD-10-CM | POA: Diagnosis not present

## 2024-02-07 ENCOUNTER — Other Ambulatory Visit: Payer: Self-pay

## 2024-02-07 ENCOUNTER — Ambulatory Visit
Admission: RE | Admit: 2024-02-07 | Discharge: 2024-02-07 | Disposition: A | Discharge status: Home / Self Care | Attending: Family Medicine | Admitting: Family Medicine

## 2024-02-07 VITALS — BP 102/70 | HR 91 | Temp 98.4°F | Resp 16

## 2024-02-07 DIAGNOSIS — L301 Dyshidrosis [pompholyx]: Secondary | ICD-10-CM

## 2024-02-07 NOTE — ED Triage Notes (Signed)
 Pt c/o dry skin on anterior of hands and painx2wks. Pt states her 4th right digit of hand was purple last night. Pt has creams at home that she has been using from dermatologist, but they are not helping

## 2024-02-07 NOTE — Discharge Instructions (Signed)
 Use moisturizing cream or lotion after bathing/washing hands.

## 2024-02-08 NOTE — ED Provider Notes (Signed)
 " [facility]    CSN: [id number] Arrival date & time: 02/07/24  9062      History   Chief Complaint Chief Complaint  Patient presents with   Hand Problem    Experiencing some discoloration on fingers. Severe eczema, dryness, and pain. Currently, wear gloves and moisturize several times throughout the day. Steroid cream seems to make the skin very thin. Currently seeking deeper explanation and solutions - Entered by patient    HPI Alicia Kelley is a 34 y.o. female.   Patient complains of persistent recurring pruritic rash on her fingers, worse during the past two weeks. Recently she has had burning/tingling sensations in her fingers/hands, worse on the right.  Last night she had some mild swelling and purplish hue of her right 4th finger.  Review of records reveals that she has been evaluated/treated by a dermatologist, diagnosed with dyshidrotic eczema of her hands.  She has been prescribed topical clobetasol and tacrolimus which has been helpful in the past but not recently.  She reports that she uses gloves for various activities and applies moisturizing creams.  She believes the steroid cream seems to thin the skin on her fingers.  The history is provided by the patient.    Past Medical History:  Diagnosis Date   At high risk for breast cancer 12/10/2022   Lifetime risk (based on Beatrice Harvey) is 23.4% (calculated in November 2024) Recommended annual mammograms and consideration for annual breast MRIs    Eczema    H/O mitral valve prolapse    moderate regurgitation, mild left atrial enlargement   Mitral valve prolapse    MVP (mitral valve prolapse) 02/01/2020   Echo Aug 2021     Severe mitral regurgitation 12/22/2014   Echo Aug 2021     SVD (spontaneous vaginal delivery) 07/03/2020   Urticaria     Patient Active Problem List   Diagnosis Date Noted   S/P mitral valve repair 12/18/2023   Amenorrhea 12/18/2023   Excessive cerumen in left ear canal 12/18/2023    Dyshidrotic eczema 08/29/2023   Mild intermittent asthma without complication 08/29/2023   Family history of breast cancer 12/10/2022   At high risk for breast cancer 12/10/2022   History of PAT (paroxysmal atrial tachycardia) 03/09/2015   Migraine 12/22/2014    Past Surgical History:  Procedure Laterality Date   MITRAL VALVE REPAIR  11/18/2023   ADULT, VALVULOPLASTY, MITRAL VALVE, VIA HEARTPORT, WITH CARDIOPULMONARY BYPASS; RADICALRECONSTRUCTION   NO PAST SURGERIES     TEE WITHOUT CARDIOVERSION N/A 06/15/2022   Procedure: TRANSESOPHAGEAL ECHOCARDIOGRAM;  Surgeon: Lonni Slain, MD;  Location: [facility] INVASIVE CV LAB;  Service: Cardiovascular;  Laterality: N/A;    OB History     Gravida  1   Para  1   Term  1   Preterm      AB      Living  1      SAB      IAB      Ectopic      Multiple  0   Live Births  1            Home Medications    Prior to Admission medications  Medication Sig Start Date End Date Taking? Authorizing Provider  clindamycin (CLEOCIN T) 1 % lotion Apply 1 Application topically as needed. 12/24/23 03/23/24 Yes [provider]  albuterol  (VENTOLIN  HFA) 108 (90 Base) MCG/ACT inhaler Inhale 2 puffs into the lungs every 4 (four) hours as needed for wheezing  or shortness of breath. 08/29/23   Marinda Rocky SAILOR, MD  aspirin EC 81 MG tablet Take 81 mg by mouth daily. Swallow whole.    [provider]  cetirizine (ZYRTEC) 10 MG tablet Take 10 mg by mouth daily as needed for allergies.    [provider]  clobetasol ointment (TEMOVATE) 0.05 % Apply 1 Application topically. 01/24/23   [provider]  desonide (DESOWEN) 0.05 % ointment Apply topically. 04/03/23   [provider]  Fluocinolone  Acetonide Scalp 0.01 % OIL Apply 1 Application topically 2 (two) times daily as needed. 08/29/23   Marinda Rocky SAILOR, MD  fluticasone  (FLONASE ) 50 MCG/ACT nasal spray Place 2 sprays into both nostrils daily. 12/13/22   Jason Leita Repine, FNP  norethindrone  (MICRONOR ) 0.35 MG tablet Take 1 tablet (0.35 mg total) by mouth daily. 12/18/23   Daryl Setter, NP  Ruxolitinib Phosphate  (OPZELURA ) 1.5 % CREA External; Duration: 30 Days    [provider]    Family History Family History  Problem Relation Age of Onset   Allergic rhinitis Mother    Hypertension Mother    Colon polyps Father    Heart murmur Sister    Allergic rhinitis Brother    Stroke Maternal Grandmother    Prostate cancer Maternal Grandfather        dx 48s; no mets   Lung cancer Maternal Grandfather    Breast cancer Paternal Grandmother 47   Colon cancer Paternal Grandfather        dx <50   Prostate cancer Paternal Grandfather    Urticaria Maternal Aunt    Allergic rhinitis Maternal Aunt    Breast cancer Paternal Aunt        dx <50   Heart attack Paternal Uncle 50   Atopy Neg Hx     Social History Social History[1]   Allergies   Ethylenediamine dihydrochloride [ethylenediamine], Magnesium, Nickel, Lidocaine , and Lidocaine -menthol    Review of Systems Review of Systems  Musculoskeletal:  Negative for arthralgias and joint swelling.  Skin:  Positive for color change and rash.  All other systems reviewed and are negative.    Physical Exam Triage Vital Signs ED Triage Vitals  Encounter Vitals Group     BP 02/07/24 1001 102/70     Girls Systolic BP Percentile --      Girls Diastolic BP Percentile --      Boys Systolic BP Percentile --      Boys Diastolic BP Percentile --      Pulse Rate 02/07/24 1001 91     Resp 02/07/24 1001 16     Temp 02/07/24 1001 98.4 F (36.9 C)     Temp Source 02/07/24 1001 Oral     SpO2 02/07/24 1001 97 %     Weight --      Height --      Head Circumference --      Peak Flow --      Pain Score 02/07/24 0955 7     Pain Loc --      Pain Education --      Exclude from Growth Chart --    No data found.  Updated Vital Signs BP 102/70   Pulse 91   Temp 98.4 F (36.9 C)  (Oral)   Resp 16   LMP 01/04/2024   SpO2 97%   Visual Acuity Right Eye Distance:   Left Eye Distance:   Bilateral Distance:    Right Eye Near:   Left Eye Near:  Bilateral Near:     Physical Exam Vitals and nursing note reviewed.  Constitutional:      General: She is not in acute distress.    Appearance: She is not ill-appearing.  HENT:     Head: Normocephalic.  Cardiovascular:     Rate and Rhythm: Normal rate.  Musculoskeletal:     Comments: Fingers of both hands have scattered deep seated small vesicles on the medial and lateral edges of some fingers. Right 4th finger has no swelling or erythema.  No evidence cellulitis.  All fingers have full range of motion.  Skin:    General: Skin is warm and dry.  Neurological:     Mental Status: She is alert.      UC Treatments / Results  Labs (all labs ordered are listed, but only abnormal results are displayed) Labs Reviewed - No data to display  EKG   Radiology No results found.  Procedures Procedures (including critical care time)  Medications Ordered in UC Medications - No data to display  Initial Impression / Assessment and Plan / UC Course  I have reviewed the triage vital signs and the nursing notes.  Pertinent labs & imaging results that were available during my care of the patient were reviewed by me and considered in my medical decision making (see chart for details).    Agree with diagnosis of dyshidrotic eczema of hands. Recommend follow-up with dermatologist for additional evaluation/treatment.  Final Clinical Impressions(s) / UC Diagnoses   Final diagnoses:  Dyshidrotic eczema     Discharge Instructions      Use moisturizing cream or lotion after bathing/washing hands.    ED Prescriptions   None        [1]  Social History Tobacco Use   Smoking status: Never   Smokeless tobacco: Never  Vaping Use   Vaping status: Never Used  Substance Use Topics   Alcohol use: Yes     Comment: socially   Drug use: Never     Pauline Garnette LABOR, MD 02/08/24 2249  "

## 2024-02-13 NOTE — Progress Notes (Signed)
 Cardiac rehab notes and rhythms reviewed.

## 2024-02-14 ENCOUNTER — Ambulatory Visit

## 2024-02-14 ENCOUNTER — Encounter: Payer: Self-pay | Admitting: Emergency Medicine

## 2024-02-14 ENCOUNTER — Ambulatory Visit: Attending: Cardiovascular Disease | Admitting: Emergency Medicine

## 2024-02-14 ENCOUNTER — Ambulatory Visit (HOSPITAL_COMMUNITY)
Admission: RE | Admit: 2024-02-14 | Discharge: 2024-02-14 | Disposition: A | Source: Ambulatory Visit | Attending: Emergency Medicine

## 2024-02-14 VITALS — BP 100/67 | HR 90 | Ht 67.5 in | Wt 178.0 lb

## 2024-02-14 DIAGNOSIS — Z952 Presence of prosthetic heart valve: Secondary | ICD-10-CM | POA: Diagnosis not present

## 2024-02-14 DIAGNOSIS — R079 Chest pain, unspecified: Secondary | ICD-10-CM

## 2024-02-14 DIAGNOSIS — Z2989 Encounter for other specified prophylactic measures: Secondary | ICD-10-CM

## 2024-02-14 DIAGNOSIS — R002 Palpitations: Secondary | ICD-10-CM

## 2024-02-14 DIAGNOSIS — I341 Nonrheumatic mitral (valve) prolapse: Secondary | ICD-10-CM

## 2024-02-14 MED ORDER — GABAPENTIN 300 MG PO CAPS: 300.0000 mg | ORAL_CAPSULE | Freq: Two times a day (BID) | ORAL | 1 refills | Status: AC | PRN

## 2024-02-14 MED ORDER — AMOXICILLIN 500 MG PO TABS: ORAL_TABLET | ORAL | Status: AC

## 2024-02-14 NOTE — Progress Notes (Unsigned)
 Patient given 7 day Zio XT monitor to apply after chest xray  Hilty to read

## 2024-02-14 NOTE — Patient Instructions (Addendum)
 Medication Instructions:  START taking Ibuprofen  (Motrin ) 400 mg by mouth three times daily PRN chest pain. Can be purchased OTC.   START taking Gabapentin  (Neurontin ) 300 mg. Take one (1) tablet by mouth twice daily as needed for chest pain.  START taking Amoxicillin  (Amoxil ) 500 mg as needed. Take four (4) tablets by mouth 30 to 60 minutes prior to any dental procedure.  *If you need a refill on your cardiac medications before your next appointment, please call your pharmacy*  Lab Work: Sedimentation Rate, C-Reactive Protein today If you have labs (blood work) drawn today and your tests are completely normal, you will receive your results only by: MyChart Message (if you have MyChart) OR A paper copy in the mail If you have any lab test that is abnormal or we need to change your treatment, we will call you to review the results.  Testing/Procedures: Echocardiogram  Chest X-ray  7 Day Zio Heart Monitor  Follow-Up: At [facility], you and your health needs are our priority.  As part of our continuing mission to provide you with exceptional heart care, our providers are all part of one team.  This team includes your primary Cardiologist (physician) and Advanced Practice Providers or APPs (Physician Assistants and Nurse Practitioners) who all work together to provide you with the care you need, when you need it.  Your next appointment:   6-8 week(s)  Provider:   Vinie JAYSON Maxcy, MD or Miriam Shams, NP   We recommend signing up for the patient portal called MyChart.  Sign up information is provided on this After Visit Summary.  MyChart is used to connect with patients for Virtual Visits (Telemedicine).  Patients are able to view lab/test results, encounter notes, upcoming appointments, etc.  Non-urgent messages can be sent to your provider as well.   To learn more about what you can do with MyChart, go to [email or web address].   Other  Instructions ECHOCARDIOGRAM  Your physician has requested that you have an echocardiogram. Echocardiography is a painless test that uses sound waves to create images of your heart. It provides your doctor with information about the size and shape of your heart and how well your hearts chambers and valves are working. This procedure takes approximately one hour. There are no restrictions for this procedure. Please do NOT wear cologne, perfume, aftershave, or lotions (deodorant is allowed). Please arrive 15 minutes prior to your appointment time.  Please note: We ask at that you not bring children with you during ultrasound (echo/ vascular) testing. Due to room size and safety concerns, children are not allowed in the ultrasound rooms during exams. Our front office staff cannot provide observation of children in our lobby area while testing is being conducted. An adult accompanying a patient to their appointment will only be allowed in the ultrasound room at the discretion of the ultrasound technician under special circumstances. We apologize for any inconvenience.   CHEST X-RAY  A chest x-ray takes a picture of the organs and structures inside the chest, including the heart, lungs, and blood vessels. This test can show several things, including, whether the heart is enlarged; whether fluid is building up in the lungs; and whether pacemaker / defibrillator leads are still in place.   7 Day ZIO Heart Monitor  ZIO XT- Long Term Monitor Instructions  Your physician has requested you wear a ZIO patch monitor for 7 days.  This is a single patch monitor. [employer] supplies one patch monitor per enrollment. Additional stickers  are not available. Please do not apply patch if you will be having a Nuclear Stress Test,  Echocardiogram, Cardiac CT, MRI, or Chest Xray during the period you would be wearing the  monitor. The patch cannot be worn during these tests. You cannot remove and re-apply the  ZIO XT  patch monitor.  Your ZIO patch monitor will be mailed 3 day USPS to your address on file. It may take 3-5 days  to receive your monitor after you have been enrolled.  Once you have received your monitor, please review the enclosed instructions. Your monitor  has already been registered assigning a specific monitor serial # to you.  Billing and Patient Assistance Program Information  We have supplied [employer] with any of your insurance information on file for billing purposes. [employer] offers a sliding scale Patient Assistance Program for patients that do not have  insurance, or whose insurance does not completely cover the cost of the ZIO monitor.  You must apply for the Patient Assistance Program to qualify for this discounted rate.  To apply, please call [employer] at [phone], select option 4, select option 2, ask to apply for  Patient Assistance Program. Meredeth will ask your household income, and how many people  are in your household. They will quote your out-of-pocket cost based on that information.  [employer] will also be able to set up a 47-month, interest-free payment plan if needed.  Applying the monitor   Shave hair from upper left chest.  Hold abrader disc by orange tab. Rub abrader in 40 strokes over the upper left chest as  indicated in your monitor instructions.  Clean area with 4 enclosed alcohol pads. Let dry.  Apply patch as indicated in monitor instructions. Patch will be placed under collarbone on left  side of chest with arrow pointing upward.  Rub patch adhesive wings for 2 minutes. Remove white label marked 1. Remove the white  label marked 2. Rub patch adhesive wings for 2 additional minutes.  While looking in a mirror, press and release button in center of patch. A small green light will  flash 3-4 times. This will be your only indicator that the monitor has been turned on.  Do not shower for the first 24 hours. You may shower after the first 24 hours.  Press  the button if you feel a symptom. You will hear a small click. Record Date, Time and  Symptom in the Patient Logbook.  When you are ready to remove the patch, follow instructions on the last 2 pages of Patient  Logbook. Stick patch monitor onto the last page of Patient Logbook.  Place Patient Logbook in the blue and white box. Use locking tab on box and tape box closed  securely. The blue and white box has prepaid postage on it. Please place it in the mailbox as  soon as possible. Your physician should have your test results approximately 7 days after the  monitor has been mailed back to [facility].  Call [facility] Customer Care at [phone] if you have questions regarding  your ZIO XT patch monitor. Call them immediately if you see an orange light blinking on your  monitor.  If your monitor falls off in less than 4 days, contact our Monitor department at [phone].  If your monitor becomes loose or falls off after 4 days call [employer] at [phone] for  suggestions on securing your monitor

## 2024-02-14 NOTE — Progress Notes (Signed)
 " Cardiology Office Note:    Date:  02/14/2024   ID:  Alicia Kelley, DOB 1990-12-31, MRN [id number]  PCP:  Jason Leita Repine, FNP (Inactive)   [facility] Providers Cardiologist:  Vinie JAYSON Maxcy, MD     Referring MD: No ref. provider found   Chief complaint: Follow up MV repair     History of Present Illness:   Alicia Kelley is a 34 y.o. 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  Initially evaluated by Dr. Maxcy in 2016 for chest pain, shortness of breath, DOE, palpitations. Patient described a history of heart murmur detected as a child and underwent echo which describe what sounded like MVP. She was seen by cardiologist in [place] whose name she cannot recall and underwent echo (records unavailable). Repeat echo was ordered and showed normal LV function with thickening and bileaflet prolapse of the mitral valve with moderate regurgitation similar to echo performed at [facility] pediatric cardiology in 2014. During exercise tolerance test patient appeared to be in a flutter versus ectopic atrial tachycardia on initial ECG (details above). [name] reestablish with Dr. Maxcy in July 2021 and was overall doing well except for increased palpitations for several week that she attributed to OT that had an increased amount of caffeine. She was prescribed as needed metoprolol  for persistent palpitations/tachycardia.  In March 2025 she began developing some shortness of breath when walking up stairs or carrying her child.  She eventually underwent mitral valve repair via right thoracotomy on 11/18/2023 with a 38 mm Simulus valve repair. Subsequent echo performed on November 22, 2023 showed normal LVEF 56% with normal diastolic function, mild MR and trivial TR, PI and AI.  During her hospitalization she had an episode of hypotension which was thought to be due to a vasovagal event and improved with a fluid bolus.   Has been participating in cardiac rehab with [facility] over the last 2 months.  Most recent follow-up outpatient was with Dr. Maxcy on 12/04/2023, appeared mildly hypotensive then, Lasix and potassium supplement was stopped.  It was recommended patient begin to wean off her pain medications following her recent surgery.  Presents independently, appears stable from a cardiovascular standpoint.  She describes a new chest pain over the last 2 weeks that comes and goes, at rest or with exertion, lasting a few seconds, feeling sharp/tight in nature, 3/10 typically, deep breathing eases the pain, starts at the subxiphoid area radiating under her right breast and into between shoulder blades posteriorly.  She also reports minor shortness of breath over the last couple weeks, occurring mostly with exertion and prolonged conversation.  States her blood pressures have been running lower than before surgery, ranging anywhere in the 90s-100s systolic with occasional orthostatic changes requiring her to take positional changes slowly.  Has had intermittent nausea over the last couple weeks as well.  She has been having an increasing amount of tachypalpitations, noticing them when she has her chest pain.  She denies dark/tarry/bloody stools, hematuria, hemoptysis, hematemesis, SOB at rest, weight changes, edema.  She has been participating in cardiac rehab through [facility], their team recommended she be evaluated by her cardiologist following reports of chest pain while at their facility.   ROS:   Please see the history of present illness.    All other systems reviewed and are negative.     Past Medical History:  Diagnosis Date   At high risk  for breast cancer 12/10/2022   Lifetime risk (based on Beatrice Harvey) is 23.4% (calculated in November 2024) Recommended annual mammograms and consideration for annual breast MRIs    Eczema    H/O mitral valve prolapse    moderate regurgitation, mild left atrial enlargement    Mitral valve prolapse    MVP (mitral valve prolapse) 02/01/2020   Echo Aug 2021     Severe mitral regurgitation 12/22/2014   Echo Aug 2021     SVD (spontaneous vaginal delivery) 07/03/2020   Urticaria     Past Surgical History:  Procedure Laterality Date   MITRAL VALVE REPAIR  11/18/2023   ADULT, VALVULOPLASTY, MITRAL VALVE, VIA HEARTPORT, WITH CARDIOPULMONARY BYPASS; RADICALRECONSTRUCTION   NO PAST SURGERIES     TEE WITHOUT CARDIOVERSION N/A 06/15/2022   Procedure: TRANSESOPHAGEAL ECHOCARDIOGRAM;  Surgeon: Lonni Slain, MD;  Location: [facility] INVASIVE CV LAB;  Service: Cardiovascular;  Laterality: N/A;    Current Medications: Active Medications[1]   Allergies:   Ethylenediamine dihydrochloride [ethylenediamine], Magnesium, Nickel, Lidocaine , and Lidocaine -menthol    Social History   Socioeconomic History   Marital status: Married    Spouse name: Not on file   Number of children: Not on file   Years of education: Not on file   Highest education level: Not on file  Occupational History   Not on file  Tobacco Use   Smoking status: Never   Smokeless tobacco: Never  Vaping Use   Vaping status: Never Used  Substance and Sexual Activity   Alcohol use: Yes    Comment: socially   Drug use: Never   Sexual activity: Yes  Other Topics Concern   Not on file  Social History Narrative   Son Bethene 2022   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    Social Drivers of Health   Tobacco Use: Low Risk (02/14/2024)   Patient History    Smoking Tobacco Use: Never    Smokeless Tobacco Use: Never    Passive Exposure: Not on file  Financial Resource Strain: Low Risk  (11/18/2023)   Received from [facility]   Overall Financial Resource Strain (CARDIA)     Difficulty of Paying Living Expenses: Not hard at all  Food Insecurity: No Food Insecurity (11/25/2023)   Epic    Worried About Radiation Protection Practitioner of Food in the Last Year: Never true    Ran Out of Food in the Last Year: Never true  Transportation Needs: No Transportation Needs (11/25/2023)   Epic    Lack of Transportation (Medical): No    Lack of Transportation (Non-Medical): No  Physical Activity: Not on file  Stress: Not on file  Social Connections: Not on file  Depression (EYV7-0): Medium Risk (12/18/2023)   Depression (PHQ2-9)    PHQ-2 Score: 6  Alcohol Screen: Not on file  Housing: Unknown (11/25/2023)   Epic    Unable to Pay for Housing in the Last Year: No    Number of Times Moved in the Last Year: Not on file    Homeless in the Last Year: No  Utilities: Not At Risk (11/25/2023)   Epic    Threatened with loss of utilities: No  Health Literacy: Not on file     Family History: The patient's family history includes Allergic rhinitis  in her brother, maternal aunt, and mother; Breast cancer in her paternal aunt; Breast cancer (age of onset: 54) in her paternal grandmother; Colon cancer in her paternal grandfather; Colon polyps in her father; Heart attack (age of onset: 42) in her paternal uncle; Heart murmur in her sister; Hypertension in her mother; Lung cancer in her maternal grandfather; Prostate cancer in her maternal grandfather and paternal grandfather; Stroke in her maternal grandmother; Urticaria in her maternal aunt. There is no history of Atopy.  EKGs/Labs/Other Studies Reviewed:    The following studies were reviewed today:  EKG Interpretation Date/Time:  Friday February 14 2024 13:52:35 EST Ventricular Rate:  91 PR Interval:  162 QRS Duration:  86 QT Interval:  344 QTC Calculation: 423 R Axis:   71  Text Interpretation: Normal sinus rhythm Nonspecific T wave abnormality No significant change was found Confirmed by Wyman Meschke [phone]) on 02/14/2024 2:47:00 PM     Recent Labs: 04/11/2023: BNP 15.8; BUN 9; Creatinine, Ser 0.89; Potassium 4.8; Sodium 141  Recent Lipid Panel No results found for: CHOL, TRIG, HDL, CHOLHDL, VLDL, LDLCALC, LDLDIRECT   Risk Assessment/Calculations:                Physical Exam:    VS:  BP 100/67   Pulse 90   Ht 5' 7.5 (1.715 m)   Wt 178 lb (80.7 kg)   LMP 01/04/2024   SpO2 97%   BMI 27.47 kg/m        Wt Readings from Last 3 Encounters:  02/14/24 178 lb (80.7 kg)  12/18/23 179 lb (81.2 kg)  12/04/23 177 lb (80.3 kg)     GEN:  Well nourished, well developed in no acute distress HEENT: Normal NECK:  No carotid bruits CARDIAC:  S1-S2 normal, RRR, no murmurs, rubs, gallops RESPIRATORY:  Clear to auscultation without rales, wheezing or rhonchi, no increased work of breathing MUSCULOSKELETAL:  No edema; No deformity  SKIN: Warm and dry NEUROLOGIC:  Alert and oriented x 3 PSYCHIATRIC:  Normal affect       Assessment & Plan Chest pain of uncertain etiology EKG: NSR, 91 bpm, nonspecific T wave abnormality, no significant change from prior study Subxiphoid intermittent chest pain, occurring at rest and with exertion, relieved by deep breathing, starting 2 weeks prior, radiating into right chest and mid back, lasting for a few seconds, occurring with tachypalpitations and DOE. Patient appears clinically well throughout the visit, no signs of acute distress or increased work of breathing.  EKG with new nonspecific T WI.  Case and EKG reviewed with DOD, Dr. Delford.  Low suspicion for ischemic etiology given patient's age and risk factors, although do not see coronary workup from [facility] prior to surgical valve repair. Will order echo, chest xray, sed rate/CRP, to evaluate valvular status, rule out pericardial effusion, pulmonary etiology, as well as pericarditis.  Patient can take Motrin  400 mg 3 times daily as needed for chest pain with food, will prescribe gabapentin twice daily as needed for  chest pain as this could be musculoskeletal versus nerve pain postsurgery.  Instructed patient not to drive until she knows how this medication affects her.  Attached patient instructions she is to discharge paperwork. Palpitation Reports tachypalpitations that occur with chest pain, occurring on a daily basis Will order 1 week zio monitoring to rule out arrhythmia  MVP (mitral valve prolapse) S/P MVR (mitral valve replacement) Performed at [facility] on 11/18/2023 Participating in cardiac rehab through their services Postprocedural echo demonstrated mild mitral regurgitation that  was significantly improved from preprocedural findings Need for SBE (subacute bacterial endocarditis) prophylaxis Given history of mitral valve repair, patient will require SBE prophylaxis prior to any dental procedure. Will send in as needed prescription for amoxicillin  2 g to be taken 30-60 minutes prior to any dental procedure.   Disposition: Follow-up in 6-8 weeks with Dr. Mona, or sooner if needed.  Proceed to the ED with any new or worsening symptoms.            Medication Adjustments/Labs and Tests Ordered: Current medicines are reviewed at length with the patient today.  Concerns regarding medicines are outlined above.  Orders Placed This Encounter  Procedures   DG Chest 2 View   Sedimentation rate   C-reactive protein   LONG TERM MONITOR (3-14 DAYS)   EKG 12-Lead   ECHOCARDIOGRAM COMPLETE   Meds ordered this encounter  Medications   amoxicillin  (AMOXIL ) 500 MG tablet    Sig: Take four (4) tablets by mouth 30 to 60 minutes prior to any dental procedure.   gabapentin (NEURONTIN) 300 MG capsule    Sig: Take 1 capsule (300 mg total) by mouth 2 (two) times daily as needed (for chest pain).    Dispense:  60 capsule    Refill:  1    Patient Instructions  Medication Instructions:  START taking Ibuprofen  (Motrin ) 400 mg. Take one (1) tablet by mouth three times daily. Can be purchased OTC.   START  taking Gabapentin (Neurontin) 300 mg. Take one (1) tablet by mouth twice daily as needed for chest pain.  START taking Amoxicillin  (Amoxil ) 500 mg as needed. Take four (4) tablets by mouth 30 to 60 minutes prior to any dental procedure.  *If you need a refill on your cardiac medications before your next appointment, please call your pharmacy*  Lab Work: Sedimentation Rate, C-Reactive Protein today If you have labs (blood work) drawn today and your tests are completely normal, you will receive your results only by: MyChart Message (if you have MyChart) OR A paper copy in the mail If you have any lab test that is abnormal or we need to change your treatment, we will call you to review the results.  Testing/Procedures: Echocardiogram  Chest X-ray  7 Day Zio Heart Monitor  Follow-Up: At [facility], you and your health needs are our priority.  As part of our continuing mission to provide you with exceptional heart care, our providers are all part of one team.  This team includes your primary Cardiologist (physician) and Advanced Practice Providers or APPs (Physician Assistants and Nurse Practitioners) who all work together to provide you with the care you need, when you need it.  Your next appointment:   6-8 week(s)  Provider:   Vinie JAYSON Mona, MD or Miriam Shams, NP   We recommend signing up for the patient portal called MyChart.  Sign up information is provided on this After Visit Summary.  MyChart is used to connect with patients for Virtual Visits (Telemedicine).  Patients are able to view lab/test results, encounter notes, upcoming appointments, etc.  Non-urgent messages can be sent to your provider as well.   To learn more about what you can do with MyChart, go to [email or web address].   Other Instructions ECHOCARDIOGRAM  Your physician has requested that you have an echocardiogram. Echocardiography is a painless test that uses sound waves to create images of  your heart. It provides your doctor with information about the size and shape of your heart and how  well your hearts chambers and valves are working. This procedure takes approximately one hour. There are no restrictions for this procedure. Please do NOT wear cologne, perfume, aftershave, or lotions (deodorant is allowed). Please arrive 15 minutes prior to your appointment time.  Please note: We ask at that you not bring children with you during ultrasound (echo/ vascular) testing. Due to room size and safety concerns, children are not allowed in the ultrasound rooms during exams. Our front office staff cannot provide observation of children in our lobby area while testing is being conducted. An adult accompanying a patient to their appointment will only be allowed in the ultrasound room at the discretion of the ultrasound technician under special circumstances. We apologize for any inconvenience.   CHEST X-RAY  A chest x-ray takes a picture of the organs and structures inside the chest, including the heart, lungs, and blood vessels. This test can show several things, including, whether the heart is enlarged; whether fluid is building up in the lungs; and whether pacemaker / defibrillator leads are still in place.   7 Day ZIO Heart Monitor  ZIO XT- Long Term Monitor Instructions  Your physician has requested you wear a ZIO patch monitor for 7 days.  This is a single patch monitor. [employer] supplies one patch monitor per enrollment. Additional stickers are not available. Please do not apply patch if you will be having a Nuclear Stress Test,  Echocardiogram, Cardiac CT, MRI, or Chest Xray during the period you would be wearing the  monitor. The patch cannot be worn during these tests. You cannot remove and re-apply the  ZIO XT patch monitor.  Your ZIO patch monitor will be mailed 3 day USPS to your address on file. It may take 3-5 days  to receive your monitor after you have been enrolled.  Once  you have received your monitor, please review the enclosed instructions. Your monitor  has already been registered assigning a specific monitor serial # to you.  Billing and Patient Assistance Program Information  We have supplied [employer] with any of your insurance information on file for billing purposes. [employer] offers a sliding scale Patient Assistance Program for patients that do not have  insurance, or whose insurance does not completely cover the cost of the ZIO monitor.  You must apply for the Patient Assistance Program to qualify for this discounted rate.  To apply, please call [employer] at [phone], select option 4, select option 2, ask to apply for  Patient Assistance Program. [place] will ask your household income, and how many people  are in your household. They will quote your out-of-pocket cost based on that information.  [employer] will also be able to set up a 70-month, interest-free payment plan if needed.  Applying the monitor   Shave hair from upper left chest.  Hold abrader disc by orange tab. Rub abrader in 40 strokes over the upper left chest as  indicated in your monitor instructions.  Clean area with 4 enclosed alcohol pads. Let dry.  Apply patch as indicated in monitor instructions. Patch will be placed under collarbone on left  side of chest with arrow pointing upward.  Rub patch adhesive wings for 2 minutes. Remove white label marked 1. Remove the white  label marked 2. Rub patch adhesive wings for 2 additional minutes.  While looking in a mirror, press and release button in center of patch. A small green light will  flash 3-4 times. This will be your only indicator that the monitor has  been turned on.  Do not shower for the first 24 hours. You may shower after the first 24 hours.  Press the button if you feel a symptom. You will hear a small click. Record Date, Time and  Symptom in the Patient Logbook.  When you are ready to remove the patch, follow  instructions on the last 2 pages of Patient  Logbook. Stick patch monitor onto the last page of Patient Logbook.  Place Patient Logbook in the blue and white box. Use locking tab on box and tape box closed  securely. The blue and white box has prepaid postage on it. Please place it in the mailbox as  soon as possible. Your physician should have your test results approximately 7 days after the  monitor has been mailed back to [facility].  Call [facility] Customer Care at [phone] if you have questions regarding  your ZIO XT patch monitor. Call them immediately if you see an orange light blinking on your  monitor.  If your monitor falls off in less than 4 days, contact our Monitor department at [phone].  If your monitor becomes loose or falls off after 4 days call [employer] at [phone] for  suggestions on securing your monitor             Signed, Miriam FORBES Shams, NP  02/14/2024 3:04 PM    [facility]     [1]  Current Meds  Medication Sig   albuterol  (VENTOLIN  HFA) 108 (90 Base) MCG/ACT inhaler Inhale 2 puffs into the lungs every 4 (four) hours as needed for wheezing or shortness of breath.   amoxicillin  (AMOXIL ) 500 MG tablet Take four (4) tablets by mouth 30 to 60 minutes prior to any dental procedure.   aspirin EC 81 MG tablet Take 81 mg by mouth daily. Swallow whole.   cetirizine (ZYRTEC) 10 MG tablet Take 10 mg by mouth daily as needed for allergies.   clindamycin (CLEOCIN T) 1 % lotion Apply 1 Application topically as needed.   clobetasol ointment (TEMOVATE) 0.05 % Apply 1 Application topically.   desonide (DESOWEN) 0.05 % ointment Apply topically.   Fluocinolone  Acetonide Scalp 0.01 % OIL Apply 1 Application topically 2 (two) times daily as needed.   fluticasone  (FLONASE ) 50 MCG/ACT nasal spray Place 2 sprays into both nostrils daily.   gabapentin (NEURONTIN) 300 MG capsule Take 1 capsule (300 mg total) by mouth 2 (two) times daily as needed  (for chest pain).   norethindrone  (MICRONOR ) 0.35 MG tablet Take 1 tablet (0.35 mg total) by mouth daily.   Ruxolitinib Phosphate  (OPZELURA ) 1.5 % CREA External; Duration: 30 Days   "

## 2024-02-15 LAB — SEDIMENTATION RATE: Sed Rate: 12 mm/h (ref 0–32)

## 2024-02-15 LAB — C-REACTIVE PROTEIN: CRP: <1 mg/L (ref 0–10)

## 2024-02-17 ENCOUNTER — Ambulatory Visit: Payer: Self-pay | Admitting: Emergency Medicine

## 2024-03-03 ENCOUNTER — Ambulatory Visit: Admitting: Nurse Practitioner

## 2024-03-11 ENCOUNTER — Encounter: Admitting: Family

## 2024-03-19 ENCOUNTER — Ambulatory Visit (HOSPITAL_COMMUNITY)

## 2024-04-01 ENCOUNTER — Ambulatory Visit: Admitting: Emergency Medicine
